# Patient Record
Sex: Female | Born: 1960 | Race: White | Hispanic: No | Marital: Married | Smoking: Never smoker
Health system: Southern US, Community
[De-identification: ages and names within clinical notes are randomized; demographics above are authoritative.]

## PROBLEM LIST (undated history)

## (undated) DIAGNOSIS — Z86018 Personal history of other benign neoplasm: Secondary | ICD-10-CM

## (undated) DIAGNOSIS — Z9889 Other specified postprocedural states: Secondary | ICD-10-CM

## (undated) DIAGNOSIS — I341 Nonrheumatic mitral (valve) prolapse: Secondary | ICD-10-CM

## (undated) DIAGNOSIS — R21 Rash and other nonspecific skin eruption: Secondary | ICD-10-CM

## (undated) DIAGNOSIS — M26609 Unspecified temporomandibular joint disorder, unspecified side: Secondary | ICD-10-CM

## (undated) DIAGNOSIS — Z8619 Personal history of other infectious and parasitic diseases: Secondary | ICD-10-CM

## (undated) DIAGNOSIS — R112 Nausea with vomiting, unspecified: Secondary | ICD-10-CM

## (undated) HISTORY — PX: OTHER SURGICAL HISTORY: SHX169

## (undated) HISTORY — DX: Nonrheumatic mitral (valve) prolapse: I34.1

## (undated) HISTORY — DX: Personal history of other infectious and parasitic diseases: Z86.19

## (undated) HISTORY — DX: Personal history of other benign neoplasm: Z86.018

## (undated) HISTORY — PX: CHOLECYSTECTOMY: SHX55

## (undated) HISTORY — DX: Rash and other nonspecific skin eruption: R21

## (undated) HISTORY — DX: Unspecified temporomandibular joint disorder, unspecified side: M26.609

## (undated) HISTORY — PX: COLONOSCOPY: SHX174

## (undated) HISTORY — PX: WISDOM TOOTH EXTRACTION: SHX21

---

## 1999-06-21 ENCOUNTER — Other Ambulatory Visit: Admission: RE | Admit: 1999-06-21 | Discharge: 1999-06-21 | Payer: Self-pay | Admitting: Obstetrics and Gynecology

## 2000-05-29 ENCOUNTER — Encounter: Admission: RE | Admit: 2000-05-29 | Discharge: 2000-05-29 | Payer: Self-pay | Admitting: Obstetrics and Gynecology

## 2000-05-29 ENCOUNTER — Encounter: Payer: Self-pay | Admitting: Obstetrics and Gynecology

## 2000-07-25 ENCOUNTER — Other Ambulatory Visit: Admission: RE | Admit: 2000-07-25 | Discharge: 2000-07-25 | Payer: Self-pay | Admitting: Obstetrics and Gynecology

## 2001-02-21 ENCOUNTER — Encounter: Payer: Self-pay | Admitting: Obstetrics and Gynecology

## 2001-02-21 ENCOUNTER — Encounter: Admission: RE | Admit: 2001-02-21 | Discharge: 2001-02-21 | Payer: Self-pay | Admitting: Obstetrics and Gynecology

## 2001-08-07 ENCOUNTER — Other Ambulatory Visit: Admission: RE | Admit: 2001-08-07 | Discharge: 2001-08-07 | Payer: Self-pay | Admitting: Obstetrics and Gynecology

## 2001-09-02 ENCOUNTER — Encounter: Admission: RE | Admit: 2001-09-02 | Discharge: 2001-09-02 | Payer: Self-pay | Admitting: Obstetrics and Gynecology

## 2001-09-02 ENCOUNTER — Encounter: Payer: Self-pay | Admitting: Obstetrics and Gynecology

## 2002-02-18 ENCOUNTER — Encounter: Payer: Self-pay | Admitting: Obstetrics and Gynecology

## 2002-02-18 ENCOUNTER — Encounter: Admission: RE | Admit: 2002-02-18 | Discharge: 2002-02-18 | Payer: Self-pay | Admitting: Obstetrics and Gynecology

## 2002-09-04 ENCOUNTER — Other Ambulatory Visit: Admission: RE | Admit: 2002-09-04 | Discharge: 2002-09-04 | Payer: Self-pay | Admitting: Obstetrics and Gynecology

## 2002-09-29 ENCOUNTER — Encounter: Admission: RE | Admit: 2002-09-29 | Discharge: 2002-09-29 | Payer: Self-pay

## 2002-09-29 ENCOUNTER — Encounter: Payer: Self-pay | Admitting: Obstetrics and Gynecology

## 2003-03-17 HISTORY — PX: DILATION AND CURETTAGE OF UTERUS: SHX78

## 2003-03-31 ENCOUNTER — Ambulatory Visit (HOSPITAL_COMMUNITY): Admission: RE | Admit: 2003-03-31 | Discharge: 2003-03-31 | Payer: Self-pay | Admitting: Obstetrics and Gynecology

## 2003-03-31 ENCOUNTER — Encounter (INDEPENDENT_AMBULATORY_CARE_PROVIDER_SITE_OTHER): Payer: Self-pay | Admitting: *Deleted

## 2003-05-19 ENCOUNTER — Encounter: Admission: RE | Admit: 2003-05-19 | Discharge: 2003-05-19 | Payer: Self-pay | Admitting: Obstetrics and Gynecology

## 2003-05-19 ENCOUNTER — Encounter: Payer: Self-pay | Admitting: Obstetrics and Gynecology

## 2003-10-01 ENCOUNTER — Ambulatory Visit (HOSPITAL_COMMUNITY): Admission: RE | Admit: 2003-10-01 | Discharge: 2003-10-01 | Payer: Self-pay | Admitting: Obstetrics and Gynecology

## 2003-10-14 ENCOUNTER — Other Ambulatory Visit: Admission: RE | Admit: 2003-10-14 | Discharge: 2003-10-14 | Payer: Self-pay | Admitting: Obstetrics and Gynecology

## 2004-11-08 ENCOUNTER — Encounter: Admission: RE | Admit: 2004-11-08 | Discharge: 2004-11-08 | Payer: Self-pay | Admitting: Obstetrics and Gynecology

## 2004-11-09 ENCOUNTER — Other Ambulatory Visit: Admission: RE | Admit: 2004-11-09 | Discharge: 2004-11-09 | Payer: Self-pay | Admitting: Obstetrics and Gynecology

## 2005-07-12 ENCOUNTER — Encounter: Admission: RE | Admit: 2005-07-12 | Discharge: 2005-07-12 | Payer: Self-pay | Admitting: Obstetrics and Gynecology

## 2005-09-04 ENCOUNTER — Encounter: Admission: RE | Admit: 2005-09-04 | Discharge: 2005-09-04 | Payer: Self-pay | Admitting: General Surgery

## 2005-10-31 ENCOUNTER — Encounter: Admission: RE | Admit: 2005-10-31 | Discharge: 2005-10-31 | Payer: Self-pay | Admitting: Obstetrics and Gynecology

## 2005-12-05 ENCOUNTER — Other Ambulatory Visit: Admission: RE | Admit: 2005-12-05 | Discharge: 2005-12-05 | Payer: Self-pay | Admitting: Obstetrics and Gynecology

## 2006-09-11 ENCOUNTER — Encounter: Admission: RE | Admit: 2006-09-11 | Discharge: 2006-09-11 | Payer: Self-pay | Admitting: Obstetrics and Gynecology

## 2007-09-17 ENCOUNTER — Encounter: Admission: RE | Admit: 2007-09-17 | Discharge: 2007-09-17 | Payer: Self-pay | Admitting: Obstetrics and Gynecology

## 2007-09-18 ENCOUNTER — Encounter: Admission: RE | Admit: 2007-09-18 | Discharge: 2007-09-18 | Payer: Self-pay | Admitting: Obstetrics and Gynecology

## 2008-04-02 ENCOUNTER — Other Ambulatory Visit: Admission: RE | Admit: 2008-04-02 | Discharge: 2008-04-02 | Payer: Self-pay | Admitting: Obstetrics & Gynecology

## 2008-09-21 ENCOUNTER — Encounter: Admission: RE | Admit: 2008-09-21 | Discharge: 2008-09-21 | Payer: Self-pay | Admitting: Obstetrics & Gynecology

## 2009-09-22 ENCOUNTER — Encounter: Admission: RE | Admit: 2009-09-22 | Discharge: 2009-09-22 | Payer: Self-pay | Admitting: Obstetrics & Gynecology

## 2010-09-22 ENCOUNTER — Encounter
Admission: RE | Admit: 2010-09-22 | Discharge: 2010-09-22 | Payer: Self-pay | Source: Home / Self Care | Admitting: Obstetrics & Gynecology

## 2010-11-05 ENCOUNTER — Encounter: Payer: Self-pay | Admitting: Obstetrics and Gynecology

## 2011-03-03 NOTE — Op Note (Signed)
   NAME:  Sheila Mcbride, Sheila Mcbride                        ACCOUNT NO.:  [id number]   MEDICAL RECORD NO.:  [id number]                   PATIENT TYPE:  AMB   LOCATION:  SDC                                  FACILITY:  [facility]   PHYSICIAN:  Juluis Mire, M.D.                DATE OF BIRTH:  06/17/1961   DATE OF PROCEDURE:  03/31/2003  DATE OF DISCHARGE:                                 OPERATIVE REPORT   PREOPERATIVE DIAGNOSIS:  Abnormal uterine bleeding with endometrial polyp.   POSTOPERATIVE DIAGNOSIS:  Abnormal uterine bleeding with endometrial polyp.   PROCEDURE:  Paracervical block, cervical dilatation, hysteroscopy, resection  of endometrial tissue, endometrial curetting.   SURGEON:  Juluis Mire, M.D.   ANESTHESIA:  Paracervical block and spinal.   ESTIMATED BLOOD LOSS:  Minimal.   PACKS AND DRAINS:  None.   FLUIDS REPLACED:  There was no intra-operative blood placed.   COMPLICATIONS:  None.   INDICATIONS FOR PROCEDURE:  This is dictated in the history and physical.   DESCRIPTION OF PROCEDURE:  The patient was taken to the OR and placed in the  supine position.  After spinal anesthesia, the patient is placed in dorsal  lithotomy position using the Allen stirrups.  The patient's perineum and  vagina were prepped out with Betadine and draped as a sterile field.  Speculum was placed in the vaginal vault.  The cervix was grasped with a  single-tooth tenaculum.  The uterus was sounded to approximately 8 cm, was  posterior.  She did experience some cramping during attempts at dilation.  We instituted a paracervical block using 1% Xylocaine.  Next the cervix was  serially dilated to a size 37 Pratt dilator.  The operative hysteroscope was  then introduced.  The intrauterine cavity was distended using Sorbitol.  Visualization revealed a thickened endometrium posteriorly.  There was no  definitive polyp.  These may have been disrupted by the dilation.  We did  biopsies from the posterior,  anterior and lateral walls.  These were sent  for pathologic review.  We then obtained endometrial curettings.  There were  no signs of perforation or complications.  Bleeding was minimal. Single-  tooth tenaculum and speculum were then removed. The patient was taken out of  the dorsal lithotomy position and once alert, was transferred to the  recovery room in good condition.  Sponge, needle and instrument counts were  reported as correct by the circulating nurse.                                               Juluis Mire, M.D.    JSM/MEDQ  D:  03/31/2003  T:  03/31/2003  Job:  [id number]

## 2011-03-03 NOTE — H&P (Signed)
NAME:  Sheila Mcbride, Sheila Mcbride NO.:  [id number]   MEDICAL RECORD NO.:  [id number]                   PATIENT TYPE:  AMB   LOCATION:  SDC                                  [facility]  [facility]   PHYSICIAN:  Juluis Mire, M.D.                DATE OF BIRTH:  02-Feb-1961   DATE OF ADMISSION:  03/31/2003  DATE OF DISCHARGE:                                HISTORY & PHYSICAL   REASON FOR ADMISSION:  The patient is a 50 year old, G2, P2, married white  female who presents for hysteroscopy with D&C for evaluation of abnormal  uterine bleeding.   HISTORY OF PRESENT ILLNESS:  In relation to the present admission, the  patient is presently using condoms for birth control purposes.  For the past  two months, she has had two episodes of bleeding per month.  This has been  associated with other bright bleeding.  Overall uterine flow has increased.  She underwent a saline infusion ultrasound.  The overall uterine size was  normal, both ovarian volumes were normal.  She had a polypoid mass seen in  the anterior and posterior wall of the uterus that could be polyps or  hyperplastic changes, and she now presents for hysteroscopic resection of  these changes.   ALLERGIES:  KEFLEX.   MEDICATIONS:  None.   PAST MEDICAL HISTORY:  She has a history of mitral valve prolapse; requires  SBE prophylaxis.  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  She had a previous  laparoscopic cholecystectomy performed in 1995.  Laparoscopic evaluation of  the pelvic cavity at that time was unremarkable.   OBSTETRICAL HISTORY:  She has had two spontaneous vaginal deliveries.   FAMILY HISTORY:  Significant for a history of breast cancer, as well as  hypertension.   SOCIAL HISTORY:  No tobacco or alcohol use.   REVIEW OF SYSTEMS:  Noncontributory.   PHYSICAL EXAMINATION:  VITAL SIGNS:  The patient is afebrile, stable vital  signs.  HEENT:   The patient is normocephalic.  Pupils equal, round and reactive to  light and accommodation.  Extraocular movements are intact.  Sclerae and  conjunctivae clear.  Oropharynx clear.  NECK:  Without thyromegaly.  BREASTS:  No discrete masses.  LUNGS:  Clear.  CARDIOVASCULAR:  Regular rate.  No murmurs or gallops.  ABDOMEN:  Benign.  Does have a well-healed midline incision.  No mass,  organomegaly, or tenderness.  PELVIC:  Normal external genitalia.  Vaginal cuff is clear.  Cervix  unremarkable.  Uterus is normal size, shape, and contour.  Adnexa free of  masses or tenderness.  RECTOVAGINAL:  Clear.  EXTREMITIES:  Trace edema.  NEUROLOGIC:  Grossly within normal limits.   IMPRESSION:  1. Abnormal uterine bleeding with evidence of endometrial polyp.  2. Previous resection of pheochromocytoma.   PLAN:  At the present time,  she will undergo a hysteroscopic evaluation of  the endometrium.  The risks of surgery have been discussed including the  risk of anesthetics, the risk of infection, the risk of vascular injury that  could require transfusion or possible hysterectomy, the risk of uterine  perforation that could lead to injury to adjacent organs requiring possible  exploratory laparotomy, risk of deep vein thrombosis, and pulmonary embolus.  The patient expressed understanding of indications and risks.                                               Juluis Mire, M.D.    JSM/MEDQ  D:  03/31/2003  T:  03/31/2003  Job:  [id number]

## 2011-10-18 ENCOUNTER — Other Ambulatory Visit: Payer: Self-pay | Admitting: Obstetrics & Gynecology

## 2011-10-18 DIAGNOSIS — Z1231 Encounter for screening mammogram for malignant neoplasm of breast: Secondary | ICD-10-CM

## 2011-10-27 ENCOUNTER — Other Ambulatory Visit: Payer: Self-pay | Admitting: Obstetrics & Gynecology

## 2011-10-27 DIAGNOSIS — Z803 Family history of malignant neoplasm of breast: Secondary | ICD-10-CM

## 2011-10-30 ENCOUNTER — Ambulatory Visit
Admission: RE | Admit: 2011-10-30 | Discharge: 2011-10-30 | Disposition: A | Discharge status: Home / Self Care | Payer: 59 | Source: Ambulatory Visit | Attending: Obstetrics & Gynecology | Admitting: Obstetrics & Gynecology

## 2011-10-30 DIAGNOSIS — Z1231 Encounter for screening mammogram for malignant neoplasm of breast: Secondary | ICD-10-CM

## 2011-11-06 ENCOUNTER — Other Ambulatory Visit: Payer: Self-pay | Admitting: Obstetrics & Gynecology

## 2011-11-06 DIAGNOSIS — R928 Other abnormal and inconclusive findings on diagnostic imaging of breast: Secondary | ICD-10-CM

## 2011-11-08 ENCOUNTER — Ambulatory Visit
Admission: RE | Admit: 2011-11-08 | Discharge: 2011-11-08 | Disposition: A | Discharge status: Home / Self Care | Payer: 59 | Source: Ambulatory Visit | Attending: Obstetrics & Gynecology | Admitting: Obstetrics & Gynecology

## 2011-11-08 DIAGNOSIS — Z803 Family history of malignant neoplasm of breast: Secondary | ICD-10-CM

## 2011-11-08 MED ORDER — GADOBENATE DIMEGLUMINE 529 MG/ML IV SOLN
9.0000 mL | Freq: Once | INTRAVENOUS | Status: AC | PRN
  Administered 2011-11-08: 9 mL via INTRAVENOUS

## 2011-11-09 ENCOUNTER — Ambulatory Visit
Admission: RE | Admit: 2011-11-09 | Discharge: 2011-11-09 | Disposition: A | Discharge status: Home / Self Care | Payer: 59 | Source: Ambulatory Visit | Attending: Obstetrics & Gynecology | Admitting: Obstetrics & Gynecology

## 2011-11-09 DIAGNOSIS — R928 Other abnormal and inconclusive findings on diagnostic imaging of breast: Secondary | ICD-10-CM

## 2012-08-20 ENCOUNTER — Ambulatory Visit (AMBULATORY_SURGERY_CENTER): Payer: 59

## 2012-08-20 VITALS — Ht 61.0 in | Wt 114.4 lb

## 2012-08-20 DIAGNOSIS — Z1211 Encounter for screening for malignant neoplasm of colon: Secondary | ICD-10-CM

## 2012-08-20 MED ORDER — MOVIPREP 100 G PO SOLR: ORAL | Status: DC

## 2012-08-21 ENCOUNTER — Encounter: Payer: Self-pay | Admitting: Internal Medicine

## 2012-09-03 ENCOUNTER — Encounter: Payer: 59 | Admitting: Internal Medicine

## 2012-09-06 ENCOUNTER — Ambulatory Visit (AMBULATORY_SURGERY_CENTER): Payer: 59 | Admitting: Internal Medicine

## 2012-09-06 ENCOUNTER — Encounter: Payer: Self-pay | Admitting: Internal Medicine

## 2012-09-06 VITALS — BP 125/75 | HR 85 | Temp 98.0°F | Resp 28 | Ht 61.0 in | Wt 114.0 lb

## 2012-09-06 DIAGNOSIS — Z1211 Encounter for screening for malignant neoplasm of colon: Secondary | ICD-10-CM

## 2012-09-06 MED ORDER — SODIUM CHLORIDE 0.9 % IV SOLN: 500.0000 mL | INTRAVENOUS | Status: DC

## 2012-09-06 NOTE — Progress Notes (Signed)
Patient did not have preoperative order for IV antibiotic SSI prophylaxis. (G8918)Patient did not have preoperative order for IV antibiotic SSI prophylaxis. (G8918)Patient did not experience any of the following events: a burn prior to discharge; a fall within the facility; wrong site/side/patient/procedure/implant event; or a hospital transfer or hospital admission upon discharge from the facility. (G8907) 

## 2012-09-06 NOTE — Op Note (Signed)
[facility] [address] [place], [address]   COLONOSCOPY PROCEDURE REPORT  PATIENT: Sheila Mcbride, Sheila Mcbride  MR#: [id number] BIRTHDATE: 04/16/1961 , 51  yrs. old GENDER: Female ENDOSCOPIST: Hart Carwin, MD REFERRED BY:  Leda Quail, M.D. PROCEDURE DATE:  09/06/2012 PROCEDURE:   Colonoscopy, screening ASA CLASS:   Class I INDICATIONS:Average risk patient for colon cancer. MEDICATIONS: MAC sedation, administered by CRNA and Propofol (Diprivan) 230 mg IV  DESCRIPTION OF PROCEDURE:   After the risks and benefits and of the procedure were explained, informed consent was obtained.  A digital rectal exam revealed no abnormalities of the rectum.    The LB [id number]-[id number]  endoscope was introduced through the anus and advanced to the cecum, which was identified by both the appendix and ileocecal valve .  The quality of the prep was good, using MoviPrep .  The instrument was then slowly withdrawn as the colon was fully examined.     COLON FINDINGS: Mild diverticulosis was noted throughout the entire examined colon.     Retroflexed views revealed no abnormalities. The scope was then withdrawn from the patient and the procedure completed.  COMPLICATIONS: There were no complications. ENDOSCOPIC IMPRESSION: Mild diverticulosis was noted throughout the entire examined colon  RECOMMENDATIONS: High fiber diet   REPEAT EXAM: In 10 year(s)  for Colonoscopy.  cc:  _______________________________ eSignedHart Carwin, MD 09/06/2012 9:09 AM

## 2012-09-06 NOTE — Patient Instructions (Addendum)
YOU HAD AN ENDOSCOPIC PROCEDURE TODAY AT THE [facility]: Refer to the procedure report that was given to you for any specific questions about what was found during the examination.  If the procedure report does not answer your questions, please call your gastroenterologist to clarify.  If you requested that your care partner not be given the details of your procedure findings, then the procedure report has been included in a sealed envelope for you to review at your convenience later.  YOU SHOULD EXPECT: Some feelings of bloating in the abdomen. Passage of more gas than usual.  Walking can help get rid of the air that was put into your GI tract during the procedure and reduce the bloating. If you had a lower endoscopy (such as a colonoscopy or flexible sigmoidoscopy) you may notice spotting of blood in your stool or on the toilet paper. If you underwent a bowel prep for your procedure, then you may not have a normal bowel movement for a few days.  DIET: Your first meal following the procedure should be a light meal and then it is ok to progress to your normal diet.  A half-sandwich or bowl of soup is an example of a good first meal.  Heavy or fried foods are harder to digest and may make you feel nauseous or bloated.  Likewise meals heavy in dairy and vegetables can cause extra gas to form and this can also increase the bloating.  Drink plenty of fluids but you should avoid alcoholic beverages for 24 hours.  ACTIVITY: Your care partner should take you home directly after the procedure.  You should plan to take it easy, moving slowly for the rest of the day.  You can resume normal activity the day after the procedure however you should NOT DRIVE or use heavy machinery for 24 hours (because of the sedation medicines used during the test).    SYMPTOMS TO REPORT IMMEDIATELY: A gastroenterologist can be reached at any hour.  During normal business hours, 8:30 AM to 5:00 PM Monday through Friday,  call [phone].  After hours and on weekends, please call the GI answering service at [phone] who will take a message and have the physician on call contact you.   Following lower endoscopy (colonoscopy or flexible sigmoidoscopy):  Excessive amounts of blood in the stool  Significant tenderness or worsening of abdominal pains  Swelling of the abdomen that is new, acute  Fever of 100F or higher    FOLLOW UP: If any biopsies were taken you will be contacted by phone or by letter within the next 1-3 weeks.  Call your gastroenterologist if you have not heard about the biopsies in 3 weeks.  Our staff will call the home number listed on your records the next business day following your procedure to check on you and address any questions or concerns that you may have at that time regarding the information given to you following your procedure. This is a courtesy call and so if there is no answer at the home number and we have not heard from you through the emergency physician on call, we will assume that you have returned to your regular daily activities without incident.  SIGNATURES/CONFIDENTIALITY: You and/or your care partner have signed paperwork which will be entered into your electronic medical record.  These signatures attest to the fact that that the information above on your After Visit Summary has been reviewed and is understood.  Full responsibility of the confidentiality   of this discharge information lies with you and/or your care-partner.     Information on diverticulosis & high fiber diet given to you today 

## 2012-09-09 ENCOUNTER — Telehealth: Payer: Self-pay | Admitting: *Deleted

## 2012-09-09 NOTE — Telephone Encounter (Signed)
  Follow up Call-  Call back number 09/06/2012  Post procedure Call Back phone  # [phone]  Permission to leave phone message Yes     Patient questions:  Do you have a fever, pain , or abdominal swelling? no Pain Score  0 *  Have you tolerated food without any problems? yes  Have you been able to return to your normal activities? yes  Do you have any questions about your discharge instructions: Diet   no Medications  no Follow up visit  no  Do you have questions or concerns about your Care? no  Actions: * If pain score is 4 or above: No action needed, pain <4.

## 2012-10-03 ENCOUNTER — Other Ambulatory Visit: Payer: Self-pay | Admitting: Obstetrics & Gynecology

## 2012-10-03 DIAGNOSIS — Z1231 Encounter for screening mammogram for malignant neoplasm of breast: Secondary | ICD-10-CM

## 2012-11-07 ENCOUNTER — Ambulatory Visit
Admission: RE | Admit: 2012-11-07 | Discharge: 2012-11-07 | Disposition: A | Discharge status: Home / Self Care | Payer: 59 | Source: Ambulatory Visit | Attending: Obstetrics & Gynecology | Admitting: Obstetrics & Gynecology

## 2012-11-07 DIAGNOSIS — Z1231 Encounter for screening mammogram for malignant neoplasm of breast: Secondary | ICD-10-CM

## 2013-07-10 ENCOUNTER — Telehealth: Payer: Self-pay | Admitting: Obstetrics & Gynecology

## 2013-07-10 MED ORDER — ESTRADIOL 10 MCG VA TABS: ORAL_TABLET | VAGINAL | Status: DC

## 2013-07-10 NOTE — Telephone Encounter (Signed)
Pt calling to check on refill for vagifem that should have been sent to [facility] in [place]. Pharmacy number is [phone].

## 2013-07-10 NOTE — Telephone Encounter (Signed)
According to chart patient has been on Vagifem 10 mcg Last Refilled 06/07/11 #8 packs with refills x 1 year, patient has picked up samples last sample picked up 10/01/12.  AEX scheduled for 08/14/13 #8/1 rf's sent to last patient until [facility].  Patient is notified and aware.

## 2013-08-14 ENCOUNTER — Ambulatory Visit (INDEPENDENT_AMBULATORY_CARE_PROVIDER_SITE_OTHER): Payer: 59 | Admitting: Obstetrics & Gynecology

## 2013-08-14 ENCOUNTER — Encounter: Payer: Self-pay | Admitting: Obstetrics & Gynecology

## 2013-08-14 VITALS — BP 132/82 | HR 68 | Resp 16 | Ht 61.0 in | Wt 113.6 lb

## 2013-08-14 DIAGNOSIS — Z01419 Encounter for gynecological examination (general) (routine) without abnormal findings: Secondary | ICD-10-CM

## 2013-08-14 DIAGNOSIS — Z0000 Encounter for general adult medical examination without abnormal findings: Secondary | ICD-10-CM

## 2013-08-14 DIAGNOSIS — Z124 Encounter for screening for malignant neoplasm of cervix: Secondary | ICD-10-CM

## 2013-08-14 LAB — POCT URINALYSIS DIPSTICK
Glucose, UA: NEGATIVE
Ketones, UA: NEGATIVE
Protein, UA: NEGATIVE

## 2013-08-14 LAB — HEMOGLOBIN, FINGERSTICK: Hemoglobin, fingerstick: 13.7 g/dL (ref 12.0–16.0)

## 2013-08-14 MED ORDER — ESTROGENS, CONJUGATED 0.625 MG/GM VA CREA: TOPICAL_CREAM | VAGINAL | Status: DC

## 2013-08-14 NOTE — Patient Instructions (Signed)

## 2013-08-14 NOTE — Progress Notes (Signed)
52 y.o. G2P2 MarriedCaucasianF here for annual exam.  Feels like vagifem isn't working as well for her over last several months.  Would like to try something else for vaginal dryness.    Family hx of breast cancer in mother.  BRCA 1/2 testing last year negative.  This is MRI/MMG year, coming up.    Patient's last menstrual period was 10/16/2006.          Sexually active: yes  The current method of family planning is vasectomy.    Exercising: yes  circuit/body pump Smoker:  no  Health Maintenance: Pap:  06/27/12 WNL History of abnormal Pap:  yes MMG:  11/07/12 normal Colonoscopy:  11/13 repeat in 10 years, Dr Juanda Chance BMD:   12/11, -1.0/-1.0 TDaP:  06/27/12 Screening Labs: 2012, Hb today: 13.7, Urine today: WBC-1+   reports that she has never smoked. She has never used smokeless tobacco. She reports that she does not drink alcohol or use illicit drugs.  Past Medical History  Diagnosis Date  . H/O pheochromocytoma   . Rash     all over body in 03/13/? etiology  . MVP (mitral valve prolapse)   . H/O [facility] spotted fever   . TMJ (temporomandibular joint syndrome)     Past Surgical History  Procedure Laterality Date  . Pheochromocytoma      removal in  2000  . Cholecystectomy      and exploratory surgery in pelvic area  . Wisdom tooth extraction    . Dilation and curettage of uterus  6/04    Current Outpatient Prescriptions  Medication Sig Dispense Refill  . Estradiol (VAGIFEM) 10 MCG TABS vaginal tablet Insert 1 tablet vaginally twice weekly  8 tablet  1  . Acetaminophen (TYLENOL 8 HOUR PO) Take by mouth as needed.      . Ibuprofen (ADVIL) 200 MG CAPS Take by mouth as needed.       No current facility-administered medications for this visit.    Family History  Problem Relation Age of Onset  . Breast cancer Mother 60    deceased age 37 from blood clot in lung secondary complications in breast cancer treatment  . Heart disease Paternal Grandfather   . Bladder  Cancer Maternal Grandfather   . Osteoporosis Maternal Grandmother   . Rheum arthritis Maternal Grandmother     ROS:  Pertinent items are noted in HPI.  Otherwise, a comprehensive ROS was negative.  Exam:   BP 132/82  Pulse 68  Resp 16  Ht 5\' 1"  (1.549 m)  Wt 113 lb 9.6 oz (51.529 kg)  BMI 21.48 kg/m2  LMP 10/16/2006  Weight change: -1lb  Height: 5\' 1"  (154.9 cm)  Ht Readings from Last 3 Encounters:  08/14/13 5\' 1"  (1.549 m)  09/06/12 5\' 1"  (1.549 m)  08/20/12 5\' 1"  (1.549 m)    General appearance: alert, cooperative and appears stated age Head: Normocephalic, without obvious abnormality, atraumatic Neck: no adenopathy, supple, symmetrical, trachea midline and thyroid normal to inspection and palpation Lungs: clear to auscultation bilaterally Breasts: normal appearance, no masses or tenderness Heart: regular rate and rhythm Abdomen: soft, non-tender; bowel sounds normal; no masses,  no organomegaly Extremities: extremities normal, atraumatic, no cyanosis or edema Skin: Skin color, texture, turgor normal. No rashes or lesions Lymph nodes: Cervical, supraclavicular, and axillary nodes normal. No abnormal inguinal nodes palpated Neurologic: Grossly normal   Pelvic: External genitalia:  no lesions              Urethra:  normal appearing urethra with no masses, tenderness or lesions              Bartholins and Skenes: normal                 Vagina: normal appearing vagina with normal color and discharge, no lesions              Cervix: no lesions              Pap taken: yes Bimanual Exam:  Uterus:  normal size, contour, position, consistency, mobility, non-tender              Adnexa: normal adnexa and no mass, fullness, tenderness, no masses               Rectovaginal: Confirms               Anus:  normal sphincter tone, no lesions  A:  Well Woman with normal exam PMP, no HRT. Mother with hx of breast cancer, BRCA 1/2 negative 10/13  P:   Mammogram, 3D, and MRI in 1/15.   Reminder placed to schedule. pap smear obtained today. Trail of premarin vaginal cream 1/2 pv twice weekly.  Rx to pharmacy.  Pt to give update in 2 months. return annually or prn  An After Visit Summary was printed and given to the patient.

## 2013-08-19 LAB — IPS PAP SMEAR ONLY

## 2013-11-11 ENCOUNTER — Other Ambulatory Visit: Payer: Self-pay

## 2013-11-11 DIAGNOSIS — Z1231 Encounter for screening mammogram for malignant neoplasm of breast: Secondary | ICD-10-CM

## 2013-11-12 ENCOUNTER — Ambulatory Visit
Admission: RE | Admit: 2013-11-12 | Discharge: 2013-11-12 | Disposition: A | Discharge status: Home / Self Care | Payer: 59 | Source: Ambulatory Visit

## 2013-11-12 DIAGNOSIS — Z1231 Encounter for screening mammogram for malignant neoplasm of breast: Secondary | ICD-10-CM

## 2013-11-18 ENCOUNTER — Telehealth: Payer: Self-pay | Admitting: Emergency Medicine

## 2013-11-18 DIAGNOSIS — Z803 Family history of malignant neoplasm of breast: Secondary | ICD-10-CM

## 2013-11-18 NOTE — Telephone Encounter (Signed)
Message copied by Michele Mcalpine on Tue Nov 18, 2013 10:53 AM ------      Message from: Megan Salon      Created: Tue Nov 18, 2013  5:29 AM      Regarding: MMG and MRI       Pt has very strong hx of breast cancer.  Has yearly MMG and then every other year MRI.  This is yr for both.  Can you schedule at breast center?            MSM      ----- Message -----         From: Lyman Speller, MD         Sent: 08/14/2013   2:34 PM           To: Lyman Speller, MD      Subject: schedule and precert MRT                                        ------

## 2013-11-18 NOTE — Telephone Encounter (Signed)
Spoke with patient. She cannot keep first available appointment. 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 She will call to r/s and I advised that we will precert with insurance.

## 2013-11-18 NOTE — Telephone Encounter (Signed)
Message left to return call to [place] at [phone].   Patient scheduled for Bilateral Breast MRI w and wo contrast for [place] for 11/26/13 at 0945.   Order to [place] for Pre authorization.

## 2013-11-18 NOTE — Telephone Encounter (Signed)
ID: [id number] DOB: December 26, 1960 CPT: 28206 Dx: V16.3   Effective Date: 06.10.2014 Termination Date: Pre-Exisiting: n/a  Date: 02.03.2015 Time: 1104 Rep: May  PAC: not needed, it is recommended that medical records be submitted with claim. Ref# [id number]

## 2013-11-26 ENCOUNTER — Other Ambulatory Visit: Payer: 59

## 2013-12-02 ENCOUNTER — Ambulatory Visit
Admission: RE | Admit: 2013-12-02 | Discharge: 2013-12-02 | Disposition: A | Discharge status: Home / Self Care | Payer: 59 | Source: Ambulatory Visit | Attending: Obstetrics & Gynecology | Admitting: Obstetrics & Gynecology

## 2013-12-02 DIAGNOSIS — Z803 Family history of malignant neoplasm of breast: Secondary | ICD-10-CM

## 2013-12-02 MED ORDER — GADOBENATE DIMEGLUMINE 529 MG/ML IV SOLN
10.0000 mL | Freq: Once | INTRAVENOUS | Status: AC | PRN
  Administered 2013-12-02: 10 mL via INTRAVENOUS

## 2013-12-05 NOTE — Telephone Encounter (Signed)
Message copied by Michele Mcalpine on Fri Dec 05, 2013  9:01 AM ------      Message from: Megan Salon      Created: Thu Dec 04, 2013  1:15 PM       Inform MRI nl.  Out of hold. ------

## 2013-12-05 NOTE — Telephone Encounter (Signed)
Spoke with patient. Message from Dr. Sabra Heck given, verbalized understanding.

## 2014-03-04 ENCOUNTER — Other Ambulatory Visit: Payer: Self-pay | Admitting: Obstetrics & Gynecology

## 2014-03-05 NOTE — Telephone Encounter (Signed)
Patient calling to check on the status of this. °

## 2014-03-05 NOTE — Telephone Encounter (Signed)
Please advise if ok to give refills. Patient was given trial of premarin cream at [facility] on 08/14/13-was to give update in 2 months, no phone note. I have left message for patient to call back, no response. Normal 3D MMG done 11/12/13, breast MRI done 12/04/13//kn

## 2014-03-13 ENCOUNTER — Telehealth: Payer: Self-pay | Admitting: Obstetrics & Gynecology

## 2014-03-13 NOTE — Telephone Encounter (Signed)
PREMARIN vaginal cream  apply 1/2 gram vaginally TWICE A WEEK, Normal, Last Dose: Not Recorded    [facility] [phone]

## 2014-03-13 NOTE — Telephone Encounter (Signed)
Refill request for PREMARIN VAG CREAM Last filled by MD on - 03/06/14, #30 Last AEX - 08/14/13  Advised pt that RX was filled last week at [facility].  Advised she can have transferred to [facility] if needed.  Pt OK with picking up at [facility].

## 2014-08-17 ENCOUNTER — Encounter: Payer: Self-pay | Admitting: Obstetrics & Gynecology

## 2014-09-14 ENCOUNTER — Ambulatory Visit (INDEPENDENT_AMBULATORY_CARE_PROVIDER_SITE_OTHER): Payer: 59 | Admitting: Obstetrics & Gynecology

## 2014-09-14 ENCOUNTER — Encounter: Payer: Self-pay | Admitting: Obstetrics & Gynecology

## 2014-09-14 VITALS — BP 126/70 | HR 96 | Resp 16 | Ht 61.0 in | Wt 115.0 lb

## 2014-09-14 DIAGNOSIS — Z01419 Encounter for gynecological examination (general) (routine) without abnormal findings: Secondary | ICD-10-CM

## 2014-09-14 DIAGNOSIS — Z124 Encounter for screening for malignant neoplasm of cervix: Secondary | ICD-10-CM

## 2014-09-14 DIAGNOSIS — Z0000 Encounter for general adult medical examination without abnormal findings: Secondary | ICD-10-CM

## 2014-09-14 LAB — POCT URINALYSIS DIPSTICK
BILIRUBIN UA: NEGATIVE
GLUCOSE UA: NEGATIVE
KETONES UA: NEGATIVE
Leukocytes, UA: NEGATIVE
NITRITE UA: NEGATIVE
PH UA: 5
Protein, UA: NEGATIVE
RBC UA: NEGATIVE
Urobilinogen, UA: NEGATIVE

## 2014-09-14 LAB — HEMOGLOBIN, FINGERSTICK: Hemoglobin, fingerstick: 14.2 g/dL (ref 12.0–16.0)

## 2014-09-14 MED ORDER — ESTROGENS, CONJUGATED 0.625 MG/GM VA CREA: TOPICAL_CREAM | VAGINAL | Status: DC

## 2014-09-14 MED ORDER — LIDOCAINE 5 % EX OINT: TOPICAL_OINTMENT | CUTANEOUS | Status: DC

## 2014-09-14 NOTE — Progress Notes (Signed)
53 y.o. G2P2 MarriedCaucasianF here for annual exam.  No vaginal bleeding.  Son is in his first year of residency at [employer].    Family hx of breast cancer in mother.  BRCA testing was negative two years ago.  Doing MRI every other year.  Done 2/15.  Will do 3D MMG again this next year.    Patient's last menstrual period was 10/16/2006.          Sexually active: Yes.    The current method of family planning is post menopausal status.    Exercising: Yes.    Cardio, resistance training Smoker:  no  Health Maintenance: Pap: 08/14/13 Neg History of abnormal Pap:  yes MMG:  MR w/wo Contrast Bilateral BIRADS1: Neg Colonoscopy: 08/2012 Mild diverticulosis. Repeat 10 years  BMD:  2011, -1.0/-1.0 TDaP:  06/2012 Screening Labs: here, Hb today: 14.2, Urine today: clear   reports that she has never smoked. She has never used smokeless tobacco. She reports that she does not drink alcohol or use illicit drugs.  Past Medical History  Diagnosis Date  . H/O pheochromocytoma   . Rash     all over body in 03/13/? etiology  . MVP (mitral valve prolapse)   . H/O [facility] spotted fever   . TMJ (temporomandibular joint syndrome)     Past Surgical History  Procedure Laterality Date  . Pheochromocytoma      removal in  2000  . Cholecystectomy      and exploratory surgery in pelvic area  . Wisdom tooth extraction    . Dilation and curettage of uterus  6/04    Current Outpatient Prescriptions  Medication Sig Dispense Refill  . Acetaminophen (TYLENOL 8 HOUR PO) Take by mouth as needed.    . Ibuprofen (ADVIL) 200 MG CAPS Take by mouth as needed.    Marland Kitchen PREMARIN vaginal cream apply 1/2 gram vaginally TWICE A WEEK 30 g 0   No current facility-administered medications for this visit.    Family History  Problem Relation Age of Onset  . Breast cancer Mother 62    deceased age 39 from blood clot in lung secondary complications in breast cancer treatment  . Heart disease Paternal  Grandfather   . Bladder Cancer Maternal Grandfather   . Osteoporosis Maternal Grandmother   . Rheum arthritis Maternal Grandmother     ROS:  Pertinent items are noted in HPI.  Otherwise, a comprehensive ROS was negative.  Exam:   BP 126/70 mmHg  Pulse 96  Resp 16  Ht 5' 1"  (1.549 m)  Wt 115 lb (52.164 kg)  BMI 21.74 kg/m2  LMP 10/16/2006  Weight change: +2#  Height: 5' 1"  (154.9 cm)  Ht Readings from Last 3 Encounters:  09/14/14 5' 1"  (1.549 m)  08/14/13 5' 1"  (1.549 m)  09/06/12 5' 1"  (1.549 m)    General appearance: alert, cooperative and appears stated age Head: Normocephalic, without obvious abnormality, atraumatic Neck: no adenopathy, supple, symmetrical, trachea midline and thyroid normal to inspection and palpation Lungs: clear to auscultation bilaterally Breasts: normal appearance, no masses or tenderness Heart: regular rate and rhythm Abdomen: soft, non-tender; bowel sounds normal; no masses,  no organomegaly Extremities: extremities normal, atraumatic, no cyanosis or edema Skin: Skin color, texture, turgor normal. No rashes or lesions Lymph nodes: Cervical, supraclavicular, and axillary nodes normal. No abnormal inguinal nodes palpated Neurologic: Grossly normal   Pelvic: External genitalia:  no lesions  Urethra:  normal appearing urethra with no masses, tenderness or lesions              Bartholins and Skenes: normal                 Vagina: normal appearing vagina with normal color and discharge, no lesions              Cervix: no lesions              Pap taken: Yes.   Bimanual Exam:  Uterus:  normal size, contour, position, consistency, mobility, non-tender              Adnexa: normal adnexa and no mass, fullness, tenderness               Rectovaginal: Confirms               Anus:  normal sphincter tone, no lesions  A:  Well Woman with normal exam PMP, no HRT. Mother with hx of breast cancer, BRCA 1/2 negative 10/13.  MRI every other year.   Last done 2/15.  P: Mammogram.  Doing 3D yearly.  MRI every other year. pap smear obtained today.  Pt aware of guidelines and desires yearly.   Continue premarin vaginal cream 1/2 gram pv twice weekly.   Trial of topical Lidocaine 5% to perineum with intercourse. CMP, Lipids, TSh return annually or prn  An After Visit Summary was printed and given to the patient.

## 2014-09-15 ENCOUNTER — Telehealth: Payer: Self-pay

## 2014-09-15 LAB — COMPREHENSIVE METABOLIC PANEL
ALBUMIN: 4.6 g/dL (ref 3.5–5.2)
ALT: 16 U/L (ref 0–35)
AST: 27 U/L (ref 0–37)
Alkaline Phosphatase: 68 U/L (ref 39–117)
BUN: 13 mg/dL (ref 6–23)
CO2: 27 mEq/L (ref 19–32)
CREATININE: 0.66 mg/dL (ref 0.50–1.10)
Calcium: 9.9 mg/dL (ref 8.4–10.5)
Chloride: 98 mEq/L (ref 96–112)
GLUCOSE: 108 mg/dL — AB (ref 70–99)
POTASSIUM: 4 meq/L (ref 3.5–5.3)
Sodium: 138 mEq/L (ref 135–145)
Total Bilirubin: 0.8 mg/dL (ref 0.2–1.2)
Total Protein: 7.2 g/dL (ref 6.0–8.3)

## 2014-09-15 LAB — TSH: TSH: 2.182 u[IU]/mL (ref 0.350–4.500)

## 2014-09-15 LAB — LIPID PANEL
CHOLESTEROL: 187 mg/dL (ref 0–200)
HDL: 76 mg/dL (ref >39)
LDL CALC: 97 mg/dL (ref 0–99)
TRIGLYCERIDES: 69 mg/dL (ref <150)
Total CHOL/HDL Ratio: 2.5 Ratio
VLDL: 14 mg/dL (ref 0–40)

## 2014-09-15 NOTE — Telephone Encounter (Signed)
-----   Message from Lyman Speller, MD sent at 09/15/2014 12:34 PM EST ----- Please inform labs are normal except CMP showed mildly elevated glucose.  Pt wasn't fasting so this is fine.  Lipids, TSH, and remainder of CMP was normal.

## 2014-09-15 NOTE — Telephone Encounter (Signed)
Lmtcb//kn 

## 2014-09-16 LAB — IPS PAP SMEAR ONLY

## 2014-10-05 NOTE — Telephone Encounter (Signed)
Patient notified of all results.//kn 

## 2014-11-09 ENCOUNTER — Other Ambulatory Visit: Payer: Self-pay

## 2014-11-09 DIAGNOSIS — Z1231 Encounter for screening mammogram for malignant neoplasm of breast: Secondary | ICD-10-CM

## 2014-11-13 ENCOUNTER — Ambulatory Visit: Payer: Self-pay

## 2014-11-16 ENCOUNTER — Ambulatory Visit
Admission: RE | Admit: 2014-11-16 | Discharge: 2014-11-16 | Disposition: A | Discharge status: Home / Self Care | Payer: 59 | Source: Ambulatory Visit

## 2014-11-16 DIAGNOSIS — Z1231 Encounter for screening mammogram for malignant neoplasm of breast: Secondary | ICD-10-CM

## 2015-11-09 ENCOUNTER — Telehealth: Payer: Self-pay | Admitting: Obstetrics & Gynecology

## 2015-11-09 DIAGNOSIS — Z803 Family history of malignant neoplasm of breast: Secondary | ICD-10-CM

## 2015-11-09 DIAGNOSIS — R922 Inconclusive mammogram: Secondary | ICD-10-CM

## 2015-11-09 DIAGNOSIS — Z1239 Encounter for other screening for malignant neoplasm of breast: Secondary | ICD-10-CM

## 2015-11-09 NOTE — Telephone Encounter (Signed)
Patient called and said, "I am due for a mammogram at [facility] but it is also time for me to have an MRI of my breasts--I have that done every two years. I need a little guidance on how to best coordinate all this."

## 2015-11-09 NOTE — Telephone Encounter (Addendum)
Patient returned call. She is advised she will need to schedule mammogram first, then will need to schedule Breast MRI.  Breast MRI order has been placed.   Patient will call to schedule screening mammogram and when results of mammogram screening and in, will need to schedule breast mri. Patient agreeable.  Will call back with any concerns. Routing to provider for final review. Patient agreeable to disposition. Will close encounter.

## 2015-11-09 NOTE — Telephone Encounter (Signed)
Notes Recorded by Lyman Speller, MD on 11/17/2014 at 9:39 PM Pt had neg MRI 2/15. Will need MRI after MMG next year--2017.  Orders placed for 3D Screening mammogram and bilateral Breast MRI.   Call to patient. She is at work, she will call after 2:20 to discuss.

## 2015-11-11 ENCOUNTER — Telehealth: Payer: Self-pay | Admitting: Obstetrics & Gynecology

## 2015-11-11 NOTE — Telephone Encounter (Signed)
[place] [phone] to initiate pre-authorization for an MRI, CPT code [id number], advised by Mae (call reference [phone]) to fax clinicals to the Claims Dept at fax number [phone] for manual review. Notes faxed today

## 2015-11-16 ENCOUNTER — Ambulatory Visit (INDEPENDENT_AMBULATORY_CARE_PROVIDER_SITE_OTHER): Payer: 59 | Admitting: Obstetrics & Gynecology

## 2015-11-16 ENCOUNTER — Encounter: Payer: Self-pay | Admitting: Obstetrics & Gynecology

## 2015-11-16 VITALS — BP 142/84 | HR 88 | Resp 16 | Ht 60.75 in | Wt 112.0 lb

## 2015-11-16 DIAGNOSIS — Z0000 Encounter for general adult medical examination without abnormal findings: Secondary | ICD-10-CM

## 2015-11-16 DIAGNOSIS — Z124 Encounter for screening for malignant neoplasm of cervix: Secondary | ICD-10-CM

## 2015-11-16 DIAGNOSIS — I459 Conduction disorder, unspecified: Secondary | ICD-10-CM | POA: Diagnosis not present

## 2015-11-16 DIAGNOSIS — Z01419 Encounter for gynecological examination (general) (routine) without abnormal findings: Secondary | ICD-10-CM | POA: Diagnosis not present

## 2015-11-16 DIAGNOSIS — N952 Postmenopausal atrophic vaginitis: Secondary | ICD-10-CM

## 2015-11-16 DIAGNOSIS — Z205 Contact with and (suspected) exposure to viral hepatitis: Secondary | ICD-10-CM

## 2015-11-16 LAB — COMPREHENSIVE METABOLIC PANEL
ALT: 13 U/L (ref 6–29)
AST: 24 U/L (ref 10–35)
Albumin: 4.3 g/dL (ref 3.6–5.1)
Alkaline Phosphatase: 70 U/L (ref 33–130)
BUN: 12 mg/dL (ref 7–25)
CHLORIDE: 101 mmol/L (ref 98–110)
CO2: 29 mmol/L (ref 20–31)
CREATININE: 0.73 mg/dL (ref 0.50–1.05)
Calcium: 10 mg/dL (ref 8.6–10.4)
GLUCOSE: 91 mg/dL (ref 65–99)
POTASSIUM: 4 mmol/L (ref 3.5–5.3)
SODIUM: 139 mmol/L (ref 135–146)
TOTAL PROTEIN: 7.5 g/dL (ref 6.1–8.1)
Total Bilirubin: 0.7 mg/dL (ref 0.2–1.2)

## 2015-11-16 LAB — POCT URINALYSIS DIPSTICK
Bilirubin, UA: NEGATIVE
Glucose, UA: NEGATIVE
KETONES UA: NEGATIVE
Leukocytes, UA: NEGATIVE
Nitrite, UA: NEGATIVE
PH UA: 6
PROTEIN UA: NEGATIVE
RBC UA: NEGATIVE
UROBILINOGEN UA: NEGATIVE

## 2015-11-16 LAB — LIPID PANEL
CHOL/HDL RATIO: 2.2 ratio (ref <=5.0)
Cholesterol: 178 mg/dL (ref 125–200)
HDL: 80 mg/dL (ref >=46)
LDL CALC: 85 mg/dL (ref <130)
Triglycerides: 64 mg/dL (ref <150)
VLDL: 13 mg/dL (ref <30)

## 2015-11-16 MED ORDER — ESTROGENS, CONJUGATED 0.625 MG/GM VA CREA: TOPICAL_CREAM | VAGINAL | Status: DC

## 2015-11-16 NOTE — Progress Notes (Signed)
55 y.o. G2P2 MarriedCaucasianF here for annual exam.  Doing well.  Son is in her second year of internal medicine residency.  His wife is expecting.  Daughter is in [place] and her husband is in medical school at [employer].    PCP:  Dr. Melford Aase  Patient's last menstrual period was 10/16/2006.          Sexually active: Yes.    The current method of family planning is post menopausal status.    Exercising: Yes.    resistance training, cardio intervals  Smoker:  no  Health Maintenance: Pap:  09/14/14 Neg. History of abnormal Pap:  yes MMG: 11/17/14 BIRADS1:neg. Screening MMG and  MRI Breast Bilateral scheduled 11/29/15 Colonoscopy:  09/06/12 Mild Diverticulosis- repeat 10 years  BMD:  09/22/10 Normal  TDaP:  06/2012 Screening Labs: Here, Hb today: pending, Urine today: pending    reports that she has never smoked. She has never used smokeless tobacco. She reports that she does not drink alcohol or use illicit drugs.  Past Medical History  Diagnosis Date  . H/O pheochromocytoma   . Rash     all over body in 03/13/? etiology  . MVP (mitral valve prolapse)   . H/O [facility] spotted fever   . TMJ (temporomandibular joint syndrome)     Past Surgical History  Procedure Laterality Date  . Pheochromocytoma      removal in  2000  . Cholecystectomy      and exploratory surgery in pelvic area  . Wisdom tooth extraction    . Dilation and curettage of uterus  6/04    Current Outpatient Prescriptions  Medication Sig Dispense Refill  . Acetaminophen (TYLENOL 8 HOUR PO) Take by mouth as needed.    . conjugated estrogens (PREMARIN) vaginal cream apply 1/2 gram vaginally TWICE A WEEK 30 g 5  . Cyanocobalamin (VITAMIN B 12) 100 MCG LOZG Take by mouth daily.    . Ibuprofen (ADVIL) 200 MG CAPS Take by mouth as needed.    . lidocaine (XYLOCAINE) 5 % ointment Apply topically as directed (Patient not taking: Reported on 11/16/2015) 1.25 g 1   No current facility-administered medications for this  visit.    Family History  Problem Relation Age of Onset  . Breast cancer Mother 15    deceased age 24 from blood clot in lung secondary complications in breast cancer treatment  . Heart disease Paternal Grandfather   . Bladder Cancer Maternal Grandfather   . Osteoporosis Maternal Grandmother   . Rheum arthritis Maternal Grandmother     ROS:  Pertinent items are noted in HPI.  Otherwise, a comprehensive ROS was negative.  Exam:   BP 144/90 mmHg  Pulse 88  Resp 16  Ht 5' 0.75" (1.543 m)  Wt 112 lb (50.803 kg)  BMI 21.34 kg/m2  LMP 10/16/2006  Weight change: -3#   Height: 5' 0.75" (154.3 cm)  Ht Readings from Last 3 Encounters:  11/16/15 5' 0.75" (1.543 m)  09/14/14 5' 1"  (1.549 m)  08/14/13 5' 1"  (1.549 m)    General appearance: alert, cooperative and appears stated age Head: Normocephalic, without obvious abnormality, atraumatic Neck: no adenopathy, supple, symmetrical, trachea midline and thyroid normal to inspection and palpation Lungs: clear to auscultation bilaterally Breasts: normal appearance, no masses or tenderness Heart: rate is regular however there are occasional skipped beats on exam. Listened for over 2 minutes and noted at least 5-6 skipped beats.  Abdomen: soft, non-tender; bowel sounds normal; no masses,  no organomegaly  Extremities: extremities normal, atraumatic, no cyanosis or edema Skin: Skin color, texture, turgor normal. No rashes or lesions Lymph nodes: Cervical, supraclavicular, and axillary nodes normal. No abnormal inguinal nodes palpated Neurologic: Grossly normal   Pelvic: External genitalia:  no lesions              Urethra:  normal appearing urethra with no masses, tenderness or lesions              Bartholins and Skenes: normal                 Vagina: normal appearing vagina with normal color and discharge, no lesions              Cervix: no lesions              Pap taken: Yes.   Bimanual Exam:  Uterus:  normal size, contour, position,  consistency, mobility, non-tender              Adnexa: normal adnexa and no mass, fullness, tenderness               Rectovaginal: Confirms               Anus:  normal sphincter tone, no lesions  Chaperone was present for exam.  A:  Well Woman with normal exam PMP, no HRT. Mother with hx of breast cancer, BRCA 1/2 negative 10/13. MRI every other year. Scheduled for 11/29/15. Skipped beats on cardiac exam.  H/O mitral valve prolapse.  P: Mammogram. Doing 3D yearly. MRI every other year. pap smear obtained today. Pt aware of guidelines and desires yearly.  HPV obtained this year as well  Continue premarin vaginal cream 1/2 gram pv twice weekly.  Rx to pharmacy. CMP, Lipids, TSH, Vit D Hep C ab pending Cardiology referral return annually or prn

## 2015-11-17 LAB — HEPATITIS C ANTIBODY: HCV AB: NEGATIVE

## 2015-11-17 LAB — TSH: TSH: 2.537 u[IU]/mL (ref 0.350–4.500)

## 2015-11-17 LAB — VITAMIN D 25 HYDROXY (VIT D DEFICIENCY, FRACTURES): VIT D 25 HYDROXY: 26 ng/mL — AB (ref 30–100)

## 2015-11-17 LAB — IPS PAP TEST WITH REFLEX TO HPV

## 2015-11-18 LAB — HEMOGLOBIN, FINGERSTICK: Hemoglobin, fingerstick: 14.2 g/dL (ref 12.0–16.0)

## 2015-11-25 ENCOUNTER — Telehealth: Payer: Self-pay | Admitting: Obstetrics & Gynecology

## 2015-11-25 ENCOUNTER — Ambulatory Visit
Admission: RE | Admit: 2015-11-25 | Discharge: 2015-11-25 | Disposition: A | Discharge status: Home / Self Care | Payer: 59 | Source: Ambulatory Visit | Attending: Obstetrics & Gynecology | Admitting: Obstetrics & Gynecology

## 2015-11-25 DIAGNOSIS — Z1239 Encounter for other screening for malignant neoplasm of breast: Secondary | ICD-10-CM

## 2015-11-25 NOTE — Telephone Encounter (Signed)
Called, spoke with the patient and relayed the following referral information:  Appointment with cardiologist:   Dr. Jenkins Rouge [address] [place]  [place], [place] [address] [phone]  Date: 12/10/15 to arrive at 9:30 AM for a 10:00 AM appointment.  Patient expressed understanding of the appointment details and appreciation for the referral. 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 Kyla Balzarine office to reschedule.

## 2015-11-29 ENCOUNTER — Ambulatory Visit
Admission: RE | Admit: 2015-11-29 | Discharge: 2015-11-29 | Disposition: A | Discharge status: Home / Self Care | Payer: 59 | Source: Ambulatory Visit | Attending: Obstetrics & Gynecology | Admitting: Obstetrics & Gynecology

## 2015-11-29 DIAGNOSIS — R922 Inconclusive mammogram: Secondary | ICD-10-CM

## 2015-11-29 DIAGNOSIS — Z803 Family history of malignant neoplasm of breast: Secondary | ICD-10-CM

## 2015-11-29 MED ORDER — GADOBENATE DIMEGLUMINE 529 MG/ML IV SOLN
10.0000 mL | Freq: Once | INTRAVENOUS | Status: AC | PRN
  Administered 2015-11-29: 10 mL via INTRAVENOUS

## 2015-12-02 NOTE — Telephone Encounter (Signed)
Okay to close encounter or is further follow up needed? °

## 2015-12-03 NOTE — Telephone Encounter (Signed)
Ok to close encounter.  Pt having skipped beats on physical exam but she is not symptomatic for this.

## 2015-12-04 ENCOUNTER — Encounter: Payer: Self-pay | Admitting: Obstetrics & Gynecology

## 2015-12-06 ENCOUNTER — Telehealth: Payer: Self-pay | Admitting: Emergency Medicine

## 2015-12-06 NOTE — Telephone Encounter (Signed)
-----   Message from Megan Salon, MD sent at 12/04/2015  7:31 AM EST ----- Regarding: MRi Breast MRI was negative.  Out of MMG hold/recall this year if still in it.

## 2015-12-06 NOTE — Telephone Encounter (Signed)
Out of hold per Dr. Miller.   

## 2015-12-10 ENCOUNTER — Ambulatory Visit: Payer: 59 | Admitting: Cardiovascular Disease

## 2015-12-15 ENCOUNTER — Telehealth: Payer: Self-pay | Admitting: Obstetrics & Gynecology

## 2015-12-15 NOTE — Telephone Encounter (Addendum)
Patient rescheduled appointment from Dr. Johnsie Cancel to Dr. Acie Fredrickson. Appointment is 12/16/15. Changed referral to reflect physician change. Patient notified.

## 2015-12-15 NOTE — Telephone Encounter (Signed)
Patient was referred to a cardiologist by Dr.Miller. Patient was able to get an appointment earlier at a different office. Patient is asking that the referral be changed to the doctor she will be seeing tomorrow.

## 2015-12-16 ENCOUNTER — Encounter: Payer: Self-pay | Admitting: Cardiovascular Disease

## 2015-12-16 ENCOUNTER — Ambulatory Visit (INDEPENDENT_AMBULATORY_CARE_PROVIDER_SITE_OTHER): Payer: 59 | Admitting: Cardiovascular Disease

## 2015-12-16 VITALS — BP 140/90 | HR 136 | Ht 60.0 in | Wt 112.0 lb

## 2015-12-16 DIAGNOSIS — Z86018 Personal history of other benign neoplasm: Secondary | ICD-10-CM

## 2015-12-16 DIAGNOSIS — R002 Palpitations: Secondary | ICD-10-CM | POA: Diagnosis not present

## 2015-12-16 DIAGNOSIS — R000 Tachycardia, unspecified: Secondary | ICD-10-CM | POA: Diagnosis not present

## 2015-12-16 DIAGNOSIS — I10 Essential (primary) hypertension: Secondary | ICD-10-CM

## 2015-12-16 DIAGNOSIS — D3500 Benign neoplasm of unspecified adrenal gland: Secondary | ICD-10-CM

## 2015-12-16 DIAGNOSIS — Z8639 Personal history of other endocrine, nutritional and metabolic disease: Secondary | ICD-10-CM

## 2015-12-16 MED ORDER — METOPROLOL TARTRATE 25 MG PO TABS: 12.5000 mg | ORAL_TABLET | Freq: Two times a day (BID) | ORAL | Status: DC

## 2015-12-16 NOTE — Patient Instructions (Addendum)
Medication Instructions:  START Metoprolol 12.5 mg twice daily - take 12 hours apart - DO NOT START UNTIL AFTER URINE TEST   Labwork: Your physician recommends that you return for lab work - we will call you regarding the urine test   Testing/Procedures: Your physician has requested that you have an echocardiogram approximately (at least 1 week from 12/16/15). Echocardiography is a painless test that uses sound waves to create images of your heart. It provides your doctor with information about the size and shape of your heart and how well your heart's chambers and valves are working. This procedure takes approximately one hour. There are no restrictions for this procedure.   Follow-Up: Your physician recommends that you schedule a follow-up appointment in: 2-3 weeks with Dr. Acie Fredrickson - March 23 at 4:30   If you need a refill on your cardiac medications before your next appointment, please call your pharmacy.   Thank you for choosing [facility]! Christen Bame, RN [phone]

## 2015-12-16 NOTE — Progress Notes (Addendum)
Cardiology Office Note   Date:  12/16/2015   ID:  Sheila Mcbride, DOB May 20, 1961, MRN [id number]  PCP:  Chesley Noon, MD  Cardiologist:   Thayer Headings, MD   Problem List 1. PVCs 2. Sinus tachycardia 3.  Pheochromocytoma - resected at [facility] ~ Jan. 1990 4. cholecystectomy 5. Mitral valve prolapse 6.   Chief Complaint  Patient presents with  . New Patient (Initial Visit)    NO COMPLAINTS. NO CP, SOB, OR SWELLING  . Tachycardia  . Palpitations  . Hypertension      History of Present Illness: Sheila Mcbride is a 55 y.o. female who presents for evaluation of HR irregularities. She could not feel the palpitations  . Since her doctor told her about then PVC's, she now has become very aware of them Hx of Pheochromocytoma Gets very anxious when she comes to the doctor,  BP and HR typically increase Today she had normal CP and HR Here in the office , HR and BP are  markedly elevated.   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   Feels better after exercising .   No limitations with her exercise.   Hx of MVP diagnosed 20+ years ago .       Past Medical History  Diagnosis Date  . H/O pheochromocytoma   . Rash     all over body in 03/13/? etiology  . MVP (mitral valve prolapse)   . H/O [facility] spotted fever   . TMJ (temporomandibular joint syndrome)     Past Surgical History  Procedure Laterality Date  . Pheochromocytoma      removal in  2000  . Cholecystectomy      and exploratory surgery in pelvic area  . Wisdom tooth extraction    . Dilation and curettage of uterus  6/04     Current Outpatient Prescriptions  Medication Sig Dispense Refill  . Acetaminophen (TYLENOL 8 HOUR PO) Take 2 capsules by mouth as needed (AS NEEDED FOR HEADACHE).     Marland Kitchen conjugated estrogens (PREMARIN) vaginal cream apply 1/2  gram vaginally TWICE A WEEK 30 g 5  . Cyanocobalamin (VITAMIN B 12) 100 MCG LOZG Take 1 tablet by mouth daily.     . Ibuprofen (ADVIL) 200 MG CAPS Take 2 capsules by mouth as needed (AS NEEDED FOR MODERATE PAIN).     Marland Kitchen lidocaine (XYLOCAINE) 5 % ointment Apply topically as directed (Patient taking differently: Apply 1 application topically daily as needed for mild pain or moderate pain. Apply topically as directed) 1.25 g 1  . metoprolol tartrate (LOPRESSOR) 25 MG tablet Take 0.5 tablets (12.5 mg total) by mouth 2 (two) times daily. 30 tablet 11   No current facility-administered medications for this visit.    Allergies:   Sulfa antibiotics and Epinephrine    Social History:  The patient  reports that she has never smoked. She has never used smokeless tobacco. She reports that she does not drink alcohol or use illicit drugs.   Family History:  The patient's family history includes Bladder Cancer in her maternal grandfather; Breast cancer (age of onset: 22) in her mother; Heart disease in her paternal grandfather; Osteoporosis in her maternal grandmother; Rheum arthritis in her maternal grandmother.    ROS:  Please see the history of present illness.  Review of Systems: Constitutional:  denies fever, chills, diaphoresis, appetite change and fatigue.  HEENT: denies photophobia, eye pain, redness, hearing loss, ear pain, congestion, sore throat, rhinorrhea, sneezing, neck pain, neck stiffness and tinnitus.  Respiratory: denies SOB, DOE, cough, chest tightness, and wheezing.  Cardiovascular: denies chest pain, palpitations and leg swelling.  Gastrointestinal: denies nausea, vomiting, abdominal pain, diarrhea, constipation, blood in stool.  Genitourinary: denies dysuria, urgency, frequency, hematuria, flank pain and difficulty urinating.  Musculoskeletal: denies  myalgias, back pain, joint swelling, arthralgias and gait problem.   Skin: denies pallor, rash and wound.  Neurological: denies  dizziness, seizures, syncope, weakness, light-headedness, numbness and headaches.   Hematological: denies adenopathy, easy bruising, personal or family bleeding history.  Psychiatric/ Behavioral: denies suicidal ideation, mood changes, confusion, nervousness, sleep disturbance and agitation.       All other systems are reviewed and negative.    PHYSICAL EXAM: VS:  BP 140/90 mmHg  Pulse 136  Ht 5' (1.524 m)  Wt 112 lb (50.803 kg)  BMI 21.87 kg/m2  LMP 10/16/2006 , BMI Body mass index is 21.87 kg/(m^2). GEN: Well nourished, well developed, in no acute distress HEENT: normal Neck: no JVD, carotid bruits, or masses Cardiac: RRR; no murmurs, rubs, or gallops,no edema  Respiratory:  clear to auscultation bilaterally, normal work of breathing GI: soft, nontender, nondistended, + BS MS: no deformity or atrophy Skin: warm and dry, no rash Neuro:  Strength and sensation are intact Psych: normal   EKG:  EKG is ordered today. The ekg ordered today demonstrates  Sinus tachycardia 136. She has occasional premature ventricular contractions.   Recent Labs: 11/16/2015: ALT 13; BUN 12; Creat 0.73; Potassium 4.0; Sodium 139; TSH 2.537    Lipid Panel    Component Value Date/Time   CHOL 178 11/16/2015 1613   TRIG 64 11/16/2015 1613   HDL 80 11/16/2015 1613   CHOLHDL 2.2 11/16/2015 1613   VLDL 13 11/16/2015 1613   LDLCALC 85 11/16/2015 1613      Wt Readings from Last 3 Encounters:  12/16/15 112 lb (50.803 kg)  11/16/15 112 lb (50.803 kg)  09/14/14 115 lb (52.164 kg)      Other studies Reviewed: Additional studies/ records that were reviewed today include: . Review of the above records demonstrates:    ASSESSMENT AND PLAN:  1.  Hx of Pheo:  Pheo has a 16% recurrence rate.  She is very tachycardic today .  Also has HTN She says that her BP and HR are very normal most of the time Gets very anxious often.    Denies any weight loss.    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    Will repeat a 24 hr urine for Epinephrine, total catacholamine Plasma fractionated metanephrines.  AS recommended by Up To Date.... She will need to be started on alpha blockade initially followed by beta blockade  Following the 24 hr urine, We will start Cardura 1 mg  .   She should measure her BP twice a day Increase Cardura up to 8 mg a day as tolerated  She should start a high salt diet ( up to 5 gm a day) to avoid orthostasis Once she is adequately beta blocked, will start Propranolol 10 QID  If she tolerates that we will change the propranolol to metoprolol   She will need an MRI of the abdomen if the 24 hr urine is abnormal She will also need yearly screening  Will start her on metoprolol 12.5 BID  after the collection of the 24 hr urine.  Will get an echo in about 1 week .      I'll see her in about 2-3 weeks.    Current medicines are reviewed at length with the patient today.  The patient does not have concerns regarding medicines.  The following changes have been made: .   Labs/ tests ordered today include: 24 hr urine for total catacholamines, VMA, plasma fractionated metanephrines.       Orders Placed This Encounter  Procedures  . EKG 12-Lead  . Echocardiogram   Disposition:   FU with me in 2-3 weeks  Nahser, Wonda Cheng, MD  12/16/2015 5:55 PM    [address], [place], [place]  [address] Phone: [phone]; Fax: [phone]   Addendum After review of the literature , will change some aspects of our management.

## 2015-12-17 ENCOUNTER — Other Ambulatory Visit: Payer: Self-pay | Admitting: *Deleted

## 2015-12-17 ENCOUNTER — Encounter: Payer: Self-pay | Admitting: *Deleted

## 2015-12-17 ENCOUNTER — Encounter: Payer: Self-pay | Admitting: Cardiovascular Disease

## 2015-12-17 DIAGNOSIS — D3500 Benign neoplasm of unspecified adrenal gland: Secondary | ICD-10-CM

## 2015-12-17 MED ORDER — PROPRANOLOL HCL 10 MG PO TABS: 10.0000 mg | ORAL_TABLET | ORAL | Status: DC

## 2015-12-17 MED ORDER — DOXAZOSIN MESYLATE 1 MG PO TABS: 1.0000 mg | ORAL_TABLET | Freq: Every day | ORAL | Status: DC

## 2015-12-18 ENCOUNTER — Other Ambulatory Visit: Payer: Self-pay | Admitting: Cardiovascular Disease

## 2015-12-18 NOTE — Addendum Note (Signed)
Addended by: Thayer Headings on: 12/18/2015 01:19 PM   Modules accepted: Orders

## 2015-12-20 ENCOUNTER — Other Ambulatory Visit: Payer: Self-pay | Admitting: Nurse Practitioner

## 2015-12-20 ENCOUNTER — Other Ambulatory Visit (HOSPITAL_COMMUNITY)
Admission: RE | Admit: 2015-12-20 | Discharge: 2015-12-20 | Disposition: A | Discharge status: Home / Self Care | Payer: 59 | Source: Ambulatory Visit | Attending: Cardiovascular Disease | Admitting: Cardiovascular Disease

## 2015-12-20 ENCOUNTER — Encounter: Payer: Self-pay | Admitting: Cardiovascular Disease

## 2015-12-20 DIAGNOSIS — D3500 Benign neoplasm of unspecified adrenal gland: Secondary | ICD-10-CM | POA: Diagnosis present

## 2015-12-20 LAB — CREATININE, URINE, 24 HOUR
COLLECTION INTERVAL-UCRE24: 24 h
CREATININE 24H UR: 952 mg/d (ref 600–1800)
CREATININE, URINE: 38.84 mg/dL
URINE TOTAL VOLUME-UCRE24: 2450 mL

## 2015-12-23 LAB — METANEPHRINES, URINE, 24 HOUR
METANEPHRINES 24H UR: 37 ug/(24.h) — AB (ref 45–290)
Metaneph Total, Ur: 15 ug/L
Normetanephrine, 24H Ur: 255 ug/24 hr (ref 82–500)
Normetanephrine, Ur: 104 ug/L
Total Volume: 2450

## 2015-12-23 LAB — CATECHOLAMINES,UR.,FREE,24 HR
DOPAMINE, UR, 24HR: 167 ug/(24.h) (ref 0–510)
Dopamine, Rand Ur: 68 ug/L
EPINEPHRINE, U, 24HR: 5 ug/(24.h) (ref 0–20)
Epinephrine, Rand Ur: 2 ug/L
NOREPINEPH RAND UR: 21 ug/L
Norepinephrine,U,24H: 51 ug/24 hr (ref 0–135)
Total Volume: 2450

## 2015-12-29 ENCOUNTER — Ambulatory Visit: Payer: 59 | Admitting: Cardiovascular Disease

## 2015-12-30 ENCOUNTER — Encounter: Payer: Self-pay | Admitting: Cardiovascular Disease

## 2015-12-30 ENCOUNTER — Other Ambulatory Visit: Payer: Self-pay

## 2015-12-30 ENCOUNTER — Ambulatory Visit (HOSPITAL_COMMUNITY): Payer: 59 | Attending: Cardiology

## 2015-12-30 DIAGNOSIS — R002 Palpitations: Secondary | ICD-10-CM

## 2015-12-30 DIAGNOSIS — I340 Nonrheumatic mitral (valve) insufficiency: Secondary | ICD-10-CM | POA: Diagnosis not present

## 2015-12-30 DIAGNOSIS — R000 Tachycardia, unspecified: Secondary | ICD-10-CM | POA: Diagnosis not present

## 2015-12-30 DIAGNOSIS — I10 Essential (primary) hypertension: Secondary | ICD-10-CM

## 2016-01-06 ENCOUNTER — Ambulatory Visit: Payer: 59 | Admitting: Cardiovascular Disease

## 2016-01-19 ENCOUNTER — Encounter: Payer: Self-pay | Admitting: Cardiovascular Disease

## 2016-01-19 ENCOUNTER — Ambulatory Visit (INDEPENDENT_AMBULATORY_CARE_PROVIDER_SITE_OTHER): Payer: 59 | Admitting: Cardiovascular Disease

## 2016-01-19 VITALS — BP 154/84 | HR 118 | Ht 60.0 in | Wt 112.8 lb

## 2016-01-19 DIAGNOSIS — R000 Tachycardia, unspecified: Secondary | ICD-10-CM | POA: Diagnosis not present

## 2016-01-19 NOTE — Progress Notes (Signed)
Cardiology Office Note   Date:  01/19/2016   ID:  [name], DOB 1961-02-02, MRN [id number]  PCP:  Chesley Noon, MD  Cardiologist:   Thayer Headings, MD   Problem List 1. PVCs 2. Sinus tachycardia 3.  Pheochromocytoma - resected at [facility] ~ Jan. 1990 4. cholecystectomy 5. Mitral valve prolapse 6.   Chief Complaint  Patient presents with  . Follow-up      History of Present Illness: Sheila Mcbride is a 55 y.o. female who presents for evaluation of HR irregularities. She could not feel the palpitations  . Since her doctor told her about then PVC's, she now has become very aware of them Hx of Pheochromocytoma Gets very anxious when she comes to the doctor,  BP and HR typically increase Today she had normal CP and HR Here in the office , HR and BP are  markedly elevated.   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   Feels better after exercising .   No limitations with her exercise.   Hx of MVP diagnosed 20+ years ago .     January 19, 2016:  Has done better.  24 hr urine did not show any suggestion of Pheochromocytoma.   She has remained very active. Has no difficulty doing her normal exercises.   Past Medical History  Diagnosis Date  . H/O pheochromocytoma   . Rash     all over body in 03/13/? etiology  . MVP (mitral valve prolapse)   . H/O [facility] spotted fever   . TMJ (temporomandibular joint syndrome)     Past Surgical History  Procedure Laterality Date  . Pheochromocytoma      removal in  2000  . Cholecystectomy      and exploratory surgery in pelvic area  . Wisdom tooth extraction    . Dilation and curettage of uterus  6/04     Current Outpatient Prescriptions  Medication Sig Dispense Refill  . Acetaminophen (TYLENOL 8 HOUR PO) Take 2 capsules by mouth as needed (AS NEEDED FOR  HEADACHE).     Marland Kitchen conjugated estrogens (PREMARIN) vaginal cream apply 1/2 gram vaginally TWICE A WEEK 30 g 5  . Cyanocobalamin (VITAMIN B 12) 100 MCG LOZG Take 1 tablet by mouth daily.     Marland Kitchen doxazosin (CARDURA) 1 MG tablet Take 1 tablet (1 mg total) by mouth daily. 30 tablet 6  . Ibuprofen (ADVIL) 200 MG CAPS Take 2 capsules by mouth as needed (AS NEEDED FOR MODERATE PAIN).     Marland Kitchen lidocaine (XYLOCAINE) 5 % ointment Apply topically as directed (Patient taking differently: Apply 1 application topically daily as needed for mild pain or moderate pain. Apply topically as directed) 1.25 g 1  . metoprolol tartrate (LOPRESSOR) 25 MG tablet Take 0.5 tablets (12.5 mg total) by mouth 2 (two) times daily. (Patient not taking: Reported on 01/19/2016) 30 tablet 11  . propranolol (INDERAL) 10 MG tablet Take 1 tablet (10 mg total) by mouth as directed. Every 6 hours as needed for fast heart rate (Patient not taking: Reported on 01/19/2016) 30 tablet 2   No current facility-administered medications for this visit.    Allergies:   Sulfa antibiotics and Epinephrine    Social History:  The patient  reports that she has never smoked. She has never used smokeless  tobacco. She reports that she does not drink alcohol or use illicit drugs.   Family History:  The patient's family history includes Bladder Cancer in her maternal grandfather; Breast cancer (age of onset: 75) in her mother; Heart disease in her paternal grandfather; Osteoporosis in her maternal grandmother; Rheum arthritis in her maternal grandmother.    ROS:  Please see the history of present illness.    Review of Systems: Constitutional:  denies fever, chills, diaphoresis, appetite change and fatigue.  HEENT: denies photophobia, eye pain, redness, hearing loss, ear pain, congestion, sore throat, rhinorrhea, sneezing, neck pain, neck stiffness and tinnitus.  Respiratory: denies SOB, DOE, cough, chest tightness, and wheezing.  Cardiovascular: denies chest  pain, palpitations and leg swelling.  Gastrointestinal: denies nausea, vomiting, abdominal pain, diarrhea, constipation, blood in stool.  Genitourinary: denies dysuria, urgency, frequency, hematuria, flank pain and difficulty urinating.  Musculoskeletal: denies  myalgias, back pain, joint swelling, arthralgias and gait problem.   Skin: denies pallor, rash and wound.  Neurological: denies dizziness, seizures, syncope, weakness, light-headedness, numbness and headaches.   Hematological: denies adenopathy, easy bruising, personal or family bleeding history.  Psychiatric/ Behavioral: denies suicidal ideation, mood changes, confusion, nervousness, sleep disturbance and agitation.       All other systems are reviewed and negative.    PHYSICAL EXAM: VS:  BP 154/84 mmHg  Pulse 118  Ht 5' (1.524 m)  Wt 112 lb 12.8 oz (51.166 kg)  BMI 22.03 kg/m2  LMP 10/16/2006 , BMI Body mass index is 22.03 kg/(m^2). GEN: Well nourished, well developed, in no acute distress HEENT: normal Neck: no JVD, carotid bruits, or masses Cardiac: RRR; no murmurs, rubs, or gallops,no edema  Respiratory:  clear to auscultation bilaterally, normal work of breathing GI: soft, nontender, nondistended, + BS MS: no deformity or atrophy Skin: warm and dry, no rash Neuro:  Strength and sensation are intact Psych: normal   EKG:  EKG is ordered today. The ekg ordered today demonstrates  Sinus tachycardia 118 She has occasional premature ventricular contractions. NS ST changes - persistent .   Recent Labs: 11/16/2015: ALT 13; BUN 12; Creat 0.73; Potassium 4.0; Sodium 139; TSH 2.537    Lipid Panel    Component Value Date/Time   CHOL 178 11/16/2015 1613   TRIG 64 11/16/2015 1613   HDL 80 11/16/2015 1613   CHOLHDL 2.2 11/16/2015 1613   VLDL 13 11/16/2015 1613   LDLCALC 85 11/16/2015 1613      Wt Readings from Last 3 Encounters:  01/19/16 112 lb 12.8 oz (51.166 kg)  12/16/15 112 lb (50.803 kg)  11/16/15 112 lb  (50.803 kg)      Other studies Reviewed: Additional studies/ records that were reviewed today include: . Review of the above records demonstrates:    ASSESSMENT AND PLAN:  1.  Hx of Pheo:  Pheo has a 16% recurrence rate.  Has responded nicely to the cardura Has not tried the propranolol  I'm pleased that her 24-hour urine did not reveal any evidence of pheochromocytoma. We will continue to check her yearly for this.  2. Hypertension: Her blood pressure is fairly well-controlled.  She continues to have lots of anxiety related hypertension and tachycardia.   I have encouraged her to take propanolol as needed.  3. 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 Her original EKG showed ST segment depression but this was in the setting of significant hypertension and sinus tachycardia. She has persistent ST segment changes that I think need further evaluation.  We'll schedule her for a [employer] study.  Because she gets so nervous and her heart rate goes up so quickly. I have recommended that she take appropriate panel tablet 30 minute before coming to her appointment for her nuclear study.  4. Sinus tachycardia: Her heart rate at home seems to be normal. She gets quite anxious and her heart rate goes up whenever she comes to the doctor. I have encouraged her to use the propranolol as needed.  I'll see her back in 6 month.    Nahser, Wonda Cheng, MD  01/19/2016 6:28 PM    [place] [place],  [place], [place]  [address] Pager [phone] Phone: [phone]; Fax: [phone]   [facility]  [address] [place], [place]  [address] [phone]   Fax [phone]

## 2016-01-19 NOTE — Patient Instructions (Signed)
Medication Instructions:  Your physician recommends that you continue on your current medications as directed. Please refer to the Current Medication list given to you today.   Labwork: None Ordered   Testing/Procedures: Your physician has requested that you have a lexiscan myoview. For further information please visit [email or web address]. Please follow instruction sheet, as given.   Follow-Up: Your physician wants you to follow-up in: 6 months with Dr. Nahser.  You will receive a reminder letter in the mail two months in advance. If you don't receive a letter, please call our office to schedule the follow-up appointment.   If you need a refill on your cardiac medications before your next appointment, please call your pharmacy.   Thank you for choosing [facility]! Michelle Swinyer, RN [phone]    

## 2016-01-20 ENCOUNTER — Ambulatory Visit (HOSPITAL_COMMUNITY): Payer: 59 | Attending: Cardiology

## 2016-01-20 DIAGNOSIS — R002 Palpitations: Secondary | ICD-10-CM | POA: Insufficient documentation

## 2016-01-20 DIAGNOSIS — R9431 Abnormal electrocardiogram [ECG] [EKG]: Secondary | ICD-10-CM | POA: Insufficient documentation

## 2016-01-20 DIAGNOSIS — R9439 Abnormal result of other cardiovascular function study: Secondary | ICD-10-CM | POA: Diagnosis not present

## 2016-01-20 DIAGNOSIS — R000 Tachycardia, unspecified: Secondary | ICD-10-CM | POA: Diagnosis not present

## 2016-01-20 LAB — MYOCARDIAL PERFUSION IMAGING
CHL CUP NUCLEAR SSS: 3
CSEPPHR: 121 {beats}/min
LHR: 0.28
LV sys vol: 44 mL
LVDIAVOL: 91 mL (ref 46–106)
Rest HR: 91 {beats}/min
SDS: 0
SRS: 3
TID: 0.95

## 2016-01-20 MED ORDER — REGADENOSON 0.4 MG/5ML IV SOLN
0.4000 mg | Freq: Once | INTRAVENOUS | Status: AC
  Administered 2016-01-20: 0.4 mg via INTRAVENOUS

## 2016-01-20 MED ORDER — TECHNETIUM TC 99M SESTAMIBI GENERIC - CARDIOLITE
32.8000 | Freq: Once | INTRAVENOUS | Status: AC | PRN
  Administered 2016-01-20: 32.8 via INTRAVENOUS

## 2016-01-20 MED ORDER — TECHNETIUM TC 99M SESTAMIBI GENERIC - CARDIOLITE
10.1000 | Freq: Once | INTRAVENOUS | Status: AC | PRN
  Administered 2016-01-20: 10.1 via INTRAVENOUS

## 2016-03-15 ENCOUNTER — Encounter: Payer: Self-pay | Admitting: Obstetrics & Gynecology

## 2016-11-20 ENCOUNTER — Other Ambulatory Visit: Payer: Self-pay | Admitting: Obstetrics & Gynecology

## 2016-11-20 DIAGNOSIS — Z1231 Encounter for screening mammogram for malignant neoplasm of breast: Secondary | ICD-10-CM

## 2016-12-07 ENCOUNTER — Ambulatory Visit
Admission: RE | Admit: 2016-12-07 | Discharge: 2016-12-07 | Disposition: A | Discharge status: Home / Self Care | Payer: 59 | Source: Ambulatory Visit | Attending: Obstetrics & Gynecology | Admitting: Obstetrics & Gynecology

## 2016-12-07 DIAGNOSIS — Z1231 Encounter for screening mammogram for malignant neoplasm of breast: Secondary | ICD-10-CM

## 2017-02-05 ENCOUNTER — Other Ambulatory Visit (HOSPITAL_COMMUNITY)
Admission: RE | Admit: 2017-02-05 | Discharge: 2017-02-05 | Disposition: A | Discharge status: Home / Self Care | Payer: 59 | Source: Ambulatory Visit | Attending: Obstetrics & Gynecology | Admitting: Obstetrics & Gynecology

## 2017-02-05 ENCOUNTER — Ambulatory Visit (INDEPENDENT_AMBULATORY_CARE_PROVIDER_SITE_OTHER): Payer: 59 | Admitting: Obstetrics & Gynecology

## 2017-02-05 ENCOUNTER — Encounter: Payer: Self-pay | Admitting: Obstetrics & Gynecology

## 2017-02-05 VITALS — BP 148/76 | HR 80 | Resp 16 | Ht 61.0 in | Wt 113.0 lb

## 2017-02-05 DIAGNOSIS — Z01419 Encounter for gynecological examination (general) (routine) without abnormal findings: Secondary | ICD-10-CM | POA: Diagnosis not present

## 2017-02-05 DIAGNOSIS — Z124 Encounter for screening for malignant neoplasm of cervix: Secondary | ICD-10-CM | POA: Insufficient documentation

## 2017-02-05 DIAGNOSIS — Z0000 Encounter for general adult medical examination without abnormal findings: Secondary | ICD-10-CM | POA: Diagnosis not present

## 2017-02-05 LAB — COMPREHENSIVE METABOLIC PANEL
ALBUMIN: 4.3 g/dL (ref 3.6–5.1)
ALK PHOS: 66 U/L (ref 33–130)
ALT: 15 U/L (ref 6–29)
AST: 24 U/L (ref 10–35)
BILIRUBIN TOTAL: 0.6 mg/dL (ref 0.2–1.2)
BUN: 15 mg/dL (ref 7–25)
CHLORIDE: 104 mmol/L (ref 98–110)
CO2: 28 mmol/L (ref 20–31)
CREATININE: 0.78 mg/dL (ref 0.50–1.05)
Calcium: 9.8 mg/dL (ref 8.6–10.4)
Glucose, Bld: 74 mg/dL (ref 65–99)
Potassium: 4.5 mmol/L (ref 3.5–5.3)
SODIUM: 140 mmol/L (ref 135–146)
TOTAL PROTEIN: 6.9 g/dL (ref 6.1–8.1)

## 2017-02-05 LAB — CBC
HCT: 42.7 % (ref 35.0–45.0)
HEMOGLOBIN: 13.7 g/dL (ref 11.7–15.5)
MCH: 30.1 pg (ref 27.0–33.0)
MCHC: 32.1 g/dL (ref 32.0–36.0)
MCV: 93.8 fL (ref 80.0–100.0)
MPV: 10.1 fL (ref 7.5–12.5)
Platelets: 320 10*3/uL (ref 140–400)
RBC: 4.55 MIL/uL (ref 3.80–5.10)
RDW: 13 % (ref 11.0–15.0)
WBC: 6.6 10*3/uL (ref 3.8–10.8)

## 2017-02-05 LAB — TSH: TSH: 2.2 m[IU]/L

## 2017-02-05 LAB — HEMOGLOBIN, FINGERSTICK: HEMOGLOBIN, FINGERSTICK: 13.3 g/dL (ref 12.0–15.0)

## 2017-02-05 MED ORDER — LIDOCAINE 5 % EX OINT: 1.0000 "application " | TOPICAL_OINTMENT | Freq: Every day | CUTANEOUS | 0 refills | Status: DC | PRN

## 2017-02-05 MED ORDER — ESTROGENS, CONJUGATED 0.625 MG/GM VA CREA: TOPICAL_CREAM | VAGINAL | 5 refills | Status: DC

## 2017-02-05 NOTE — Progress Notes (Addendum)
56 y.o. G2P2 MarriedCaucasianF here for annual exam.  Saw Dr. Cathie Olden for palpitations.  Has sinus tachycardia and PVCs on evaluation.  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  He recommended pt continue to have yearly testing.  She does not feel the need for this.  She did have an EKG and echocardiogram done as well.  Denies vaginal bleeding.    Son is going to be doing fellowship in pulmonary critical care.  Son in Sports coach has graduating from med school in May.  He is doing ENT.    Patient's last menstrual period was 10/16/2006.          Sexually active: Yes.    The current method of family planning is post menopausal status.    Exercising: Yes.    aerobics, resistance training  Smoker:  no  Health Maintenance: Pap:  11/16/15 Neg   09/14/14 Neg History of abnormal Pap:  Yes, h/o atypical endocervical cells 6/11, 2012 MMG:  12/07/16 BIRADS1:Neg, MRI 11/29/15 Colonoscopy:  09/06/12 F/u 10 years  BMD:   09/22/10 Normal  TDaP:  06/2012  Pneumonia vaccine(s):  No Zostavax:   No Hep C testing: 11/16/15 Neg  Screening Labs: Here, Hb today: 13.3   reports that she has never smoked. She has never used smokeless tobacco. She reports that she does not drink alcohol or use drugs.  Past Medical History:  Diagnosis Date  . H/O pheochromocytoma   . H/O [facility] spotted fever   . MVP (mitral valve prolapse)   . Rash    all over body in 03/13/? etiology  . TMJ (temporomandibular joint syndrome)     Past Surgical History:  Procedure Laterality Date  . CHOLECYSTECTOMY     and exploratory surgery in pelvic area  . DILATION AND CURETTAGE OF UTERUS  6/04  . pheochromocytoma     removal in  2000  . WISDOM TOOTH EXTRACTION      Current Outpatient Prescriptions  Medication Sig Dispense Refill  . Acetaminophen (TYLENOL 8 HOUR PO) Take 2 capsules by mouth as needed (AS NEEDED FOR HEADACHE).     Marland Kitchen conjugated estrogens (PREMARIN) vaginal cream apply 1/2  gram vaginally TWICE A WEEK 30 g 5  . Cyanocobalamin (VITAMIN B 12) 100 MCG LOZG Take 1 tablet by mouth daily.     Marland Kitchen doxazosin (CARDURA) 1 MG tablet Take 1 tablet (1 mg total) by mouth daily. 30 tablet 6  . Ibuprofen (ADVIL) 200 MG CAPS Take 2 capsules by mouth as needed (AS NEEDED FOR MODERATE PAIN).     Marland Kitchen lidocaine (XYLOCAINE) 5 % ointment Apply topically as directed (Patient taking differently: Apply 1 application topically daily as needed for mild pain or moderate pain. Apply topically as directed) 1.25 g 1  . metoprolol tartrate (LOPRESSOR) 25 MG tablet Take 0.5 tablets (12.5 mg total) by mouth 2 (two) times daily. 30 tablet 11  . propranolol (INDERAL) 10 MG tablet Take 1 tablet (10 mg total) by mouth as directed. Every 6 hours as needed for fast heart rate 30 tablet 2   No current facility-administered medications for this visit.     Family History  Problem Relation Age of Onset  . Breast cancer Mother 54    deceased age 18 from blood clot in lung secondary complications in breast cancer treatment  . Heart disease Paternal Grandfather   . Osteoporosis Maternal Grandmother   . Rheum arthritis Maternal Grandmother   . Bladder Cancer Maternal  Grandfather     ROS:  Pertinent items are noted in HPI.  Otherwise, a comprehensive ROS was negative.  Exam:   BP (!) 148/76 (BP Location: Right Arm, Patient Position: Sitting, Cuff Size: Normal)   Pulse 80   Resp 16   Ht 5' 1"  (1.549 m)   Wt 113 lb (51.3 kg)   LMP 10/16/2006   BMI 21.35 kg/m     Height: 5' 1"  (154.9 cm)  Ht Readings from Last 3 Encounters:  02/05/17 5' 1"  (1.549 m)  01/19/16 5' (1.524 m)  12/16/15 5' (1.524 m)    General appearance: alert, cooperative and appears stated age Head: Normocephalic, without obvious abnormality, atraumatic Neck: no adenopathy, supple, symmetrical, trachea midline and thyroid normal to inspection and palpation Lungs: clear to auscultation bilaterally Breasts: normal appearance, no masses  or tenderness Heart: regular rate and rhythm Abdomen: soft, non-tender; bowel sounds normal; no masses,  no organomegaly Extremities: extremities normal, atraumatic, no cyanosis or edema Skin: Skin color, texture, turgor normal. No rashes or lesions Lymph nodes: Cervical, supraclavicular, and axillary nodes normal. No abnormal inguinal nodes palpated Neurologic: Grossly normal   Pelvic: External genitalia:  no lesions              Urethra:  normal appearing urethra with no masses, tenderness or lesions              Bartholins and Skenes: normal                 Vagina: normal appearing vagina with normal color and discharge, no lesions              Cervix: no lesions              Pap taken: Yes.   Bimanual Exam:  Uterus:  normal size, contour, position, consistency, mobility, non-tender              Adnexa: normal adnexa and no mass, fullness, tenderness               Rectovaginal: Confirms               Anus:  normal sphincter tone, no lesions  Chaperone was present for exam.  A:  Well Woman with normal exam PMP, no HRT Family hx of breast cancer age 68.  BRCA 1/2 negative 10/13.  Tyrer-Cusick calculation for lifetime risk if 23.6% (done today)  P:   Mammogram yearly with every other year MRI.  Will transition to yearly MRIs.   pap smear with HR HPV obtained today CBC, CMP, Lipids, Vit D Premarin vaginal cream 1/2 pv twice weekly.  #42.5 gm/3RF RF for Lidocaine 5%, small amt externally with intercourse.  Rx to pharmacy. Return annually or prn

## 2017-02-06 ENCOUNTER — Telehealth: Payer: Self-pay

## 2017-02-06 ENCOUNTER — Encounter: Payer: Self-pay | Admitting: Obstetrics & Gynecology

## 2017-02-06 DIAGNOSIS — Z9189 Other specified personal risk factors, not elsewhere classified: Secondary | ICD-10-CM

## 2017-02-06 LAB — CYTOLOGY - PAP
Diagnosis: NEGATIVE
HPV: NOT DETECTED

## 2017-02-06 LAB — VITAMIN D 25 HYDROXY (VIT D DEFICIENCY, FRACTURES): VIT D 25 HYDROXY: 31 ng/mL (ref 30–100)

## 2017-02-06 NOTE — Telephone Encounter (Signed)
Patient has a family hx of breast cancer age 56.  BRCA 1/2 negative 10/13.  Tyrer-Cusick calculation for lifetime risk if 23.6%. Order for bilateral breast MRI w wo contrast placed for precert and scheduling per Dr.Miller's request.  Routing to [employer] for precert.

## 2017-02-07 NOTE — Telephone Encounter (Signed)
Patient is scheduled for a bilateral breast MRI w/wo contrast,  on 05/28/17 with [facility]. Will obtain prior approval for the MRI closer to the appointment time, as approvals are usually valid for thirty days.  Routing to Dr Sabra Heck for review  cc: Verline Lema Sprague

## 2017-02-07 NOTE — Telephone Encounter (Signed)
I have placed the patient in imaging hold.

## 2017-02-20 ENCOUNTER — Other Ambulatory Visit: Payer: 59

## 2017-03-06 ENCOUNTER — Ambulatory Visit: Payer: 59 | Admitting: Obstetrics & Gynecology

## 2017-05-28 ENCOUNTER — Ambulatory Visit
Admission: RE | Admit: 2017-05-28 | Discharge: 2017-05-28 | Disposition: A | Discharge status: Home / Self Care | Payer: 59 | Source: Ambulatory Visit | Attending: Obstetrics & Gynecology | Admitting: Obstetrics & Gynecology

## 2017-05-28 DIAGNOSIS — Z9189 Other specified personal risk factors, not elsewhere classified: Secondary | ICD-10-CM

## 2017-05-28 MED ORDER — GADOBENATE DIMEGLUMINE 529 MG/ML IV SOLN: 9.0000 mL | Freq: Once | INTRAVENOUS | Status: DC | PRN

## 2017-05-29 ENCOUNTER — Telehealth: Payer: Self-pay

## 2017-05-29 NOTE — Telephone Encounter (Signed)
-----   Message from Megan Salon, MD sent at 05/29/2017 12:15 AM EDT ----- Please let pt know her breast MRI is negative.  When I saw her last we discussed her hx of pheochromocytoma.  She was recommended to have yearly testing.  Let her know I did not forget about this but have not gotten any clear answers due to differing opions.  May be beneficial for her to see an endocrinologist once for official recommendations.  Would she be ok with this?  I will refer if she is.  Thanks.

## 2017-05-29 NOTE — Telephone Encounter (Signed)
Spoke with patient. Advised of message and results as seen below from [place]. Patient verbalizes understanding. Patient declines referral at this time. States she has an Musician in her family and would like to speak with them about recommendations. Will return call for referral if needed.  Routing to provider for final review. Patient agreeable to disposition. Will close encounter.

## 2017-11-27 ENCOUNTER — Other Ambulatory Visit: Payer: Self-pay | Admitting: Obstetrics & Gynecology

## 2017-11-27 DIAGNOSIS — Z1231 Encounter for screening mammogram for malignant neoplasm of breast: Secondary | ICD-10-CM

## 2017-12-14 ENCOUNTER — Ambulatory Visit: Payer: 59

## 2017-12-17 ENCOUNTER — Ambulatory Visit
Admission: RE | Admit: 2017-12-17 | Discharge: 2017-12-17 | Disposition: A | Discharge status: Home / Self Care | Payer: 59 | Source: Ambulatory Visit | Attending: Obstetrics & Gynecology | Admitting: Obstetrics & Gynecology

## 2017-12-17 DIAGNOSIS — Z1231 Encounter for screening mammogram for malignant neoplasm of breast: Secondary | ICD-10-CM

## 2018-01-14 DIAGNOSIS — S62357D Nondisplaced fracture of shaft of fifth metacarpal bone, left hand, subsequent encounter for fracture with routine healing: Secondary | ICD-10-CM | POA: Insufficient documentation

## 2018-03-18 ENCOUNTER — Other Ambulatory Visit: Payer: Self-pay | Admitting: Obstetrics & Gynecology

## 2018-03-19 NOTE — Telephone Encounter (Signed)
Medication refill request: premarin  Last AEX:  02/05/17 SM  Next AEX: 04/23/18  Last MMG (if hormonal medication request): 12/17/17 BIRADS 1 negative  Refill authorized: 02/05/17 #30g, 5RF. Today, please advise.

## 2018-04-23 ENCOUNTER — Encounter: Payer: Self-pay | Admitting: Obstetrics & Gynecology

## 2018-04-23 ENCOUNTER — Other Ambulatory Visit: Payer: Self-pay | Admitting: *Deleted

## 2018-04-23 ENCOUNTER — Encounter

## 2018-04-23 ENCOUNTER — Ambulatory Visit (INDEPENDENT_AMBULATORY_CARE_PROVIDER_SITE_OTHER): Payer: 59 | Admitting: Obstetrics & Gynecology

## 2018-04-23 ENCOUNTER — Other Ambulatory Visit: Payer: Self-pay

## 2018-04-23 VITALS — BP 158/92 | HR 96 | Resp 14 | Ht 61.0 in | Wt 111.0 lb

## 2018-04-23 DIAGNOSIS — Z0000 Encounter for general adult medical examination without abnormal findings: Secondary | ICD-10-CM | POA: Diagnosis not present

## 2018-04-23 DIAGNOSIS — E2839 Other primary ovarian failure: Secondary | ICD-10-CM

## 2018-04-23 DIAGNOSIS — Z1231 Encounter for screening mammogram for malignant neoplasm of breast: Secondary | ICD-10-CM

## 2018-04-23 DIAGNOSIS — Z01419 Encounter for gynecological examination (general) (routine) without abnormal findings: Secondary | ICD-10-CM

## 2018-04-23 DIAGNOSIS — Z9189 Other specified personal risk factors, not elsewhere classified: Secondary | ICD-10-CM

## 2018-04-23 MED ORDER — RANITIDINE HCL 150 MG PO TABS: 150.0000 mg | ORAL_TABLET | Freq: Two times a day (BID) | ORAL | 1 refills | Status: DC

## 2018-04-23 MED ORDER — METOPROLOL TARTRATE 25 MG PO TABS: 12.5000 mg | ORAL_TABLET | Freq: Two times a day (BID) | ORAL | 3 refills | Status: DC

## 2018-04-23 MED ORDER — ESTROGENS, CONJUGATED 0.625 MG/GM VA CREA: TOPICAL_CREAM | VAGINAL | 2 refills | Status: DC

## 2018-04-23 NOTE — Patient Instructions (Addendum)
Try and remember to schedule the bone density with your mammogram in March, 2020.   Zantac 150mg  twice daily for two weeks.  Please let me know if you still have symptoms in two weeks.

## 2018-04-23 NOTE — Progress Notes (Signed)
57 y.o. G2P2 MarriedCaucasianF here for annual exam.  Broke her right finger after slipping on the floor.  Does not have any deficits.   Denies vaginal bleeding.     Patient's last menstrual period was 10/16/2006.          Sexually active: Yes.    The current method of family planning is post menopausal status.    Exercising: Yes.    HIIT, strength training  Smoker:  no  Health Maintenance: Pap:  02/05/17 Neg. HR HPV:neg   11/16/15 Neg  History of abnormal Pap:  Yes, atypical endocervical cells 2011, 2012 MMG:  12/17/17 BIRADS1:Neg  Colonoscopy:  09/06/12 Normal.  BMD:   09/22/10 Normal  TDaP:  2013 Pneumonia vaccine(s):  n/a Shingrix:   Reviewed Shingrix vaccine with pt.   Hep C testing: 11/16/15 Neg  Screening Labs: Here today   reports that she has never smoked. She has never used smokeless tobacco. She reports that she does not drink alcohol or use drugs.  Past Medical History:  Diagnosis Date  . H/O pheochromocytoma   . H/O [facility] spotted fever   . MVP (mitral valve prolapse)   . Rash    all over body in 03/13/? etiology  . TMJ (temporomandibular joint syndrome)     Past Surgical History:  Procedure Laterality Date  . CHOLECYSTECTOMY     and exploratory surgery in pelvic area  . DILATION AND CURETTAGE OF UTERUS  6/04  . pheochromocytoma     removal in  2000  . WISDOM TOOTH EXTRACTION      Current Outpatient Medications  Medication Sig Dispense Refill  . Acetaminophen (TYLENOL 8 HOUR PO) Take 2 capsules by mouth as needed (AS NEEDED FOR HEADACHE).     Marland Kitchen conjugated estrogens (PREMARIN) vaginal cream Place 0.5 g vaginally 2 (two) times a week.    . Cyanocobalamin (VITAMIN B 12) 100 MCG LOZG Take 1 tablet by mouth daily.     . Ibuprofen (ADVIL) 200 MG CAPS Take 2 capsules by mouth as needed (AS NEEDED FOR MODERATE PAIN).     Marland Kitchen lidocaine (XYLOCAINE) 5 % ointment Apply 1 application topically daily as needed for mild pain or moderate pain. Apply topically as  directed 30 g 0  . metoprolol tartrate (LOPRESSOR) 25 MG tablet Take 0.5 tablets (12.5 mg total) by mouth 2 (two) times daily. 30 tablet 11   No current facility-administered medications for this visit.     Family History  Problem Relation Age of Onset  . Breast cancer Mother 3       deceased age 80 from blood clot in lung secondary complications in breast cancer treatment  . Heart disease Paternal Grandfather   . Osteoporosis Maternal Grandmother   . Rheum arthritis Maternal Grandmother   . Bladder Cancer Maternal Grandfather     Review of Systems  All other systems reviewed and are negative.   Exam:   BP (!) 158/92 (BP Location: Left Arm, Patient Position: Sitting, Cuff Size: Normal)   Pulse 96   Resp 14   Ht '5\' 1"'$  (1.549 m)   Wt 111 lb (50.3 kg)   LMP 10/16/2006   BMI 20.97 kg/m    Height: '5\' 1"'$  (154.9 cm)  Ht Readings from Last 3 Encounters:  04/23/18 '5\' 1"'$  (1.549 m)  02/05/17 '5\' 1"'$  (1.549 m)  01/19/16 5' (1.524 m)    General appearance: alert, cooperative and appears stated age Head: Normocephalic, without obvious abnormality, atraumatic Neck: no adenopathy, supple, symmetrical,  trachea midline and thyroid normal to inspection and palpation Lungs: clear to auscultation bilaterally Breasts: normal appearance, no masses or tenderness Heart: regular rate and rhythm Abdomen: soft, non-tender; bowel sounds normal; no masses,  no organomegaly Extremities: extremities normal, atraumatic, no cyanosis or edema Skin: Skin color, texture, turgor normal. No rashes or lesions Lymph nodes: Cervical, supraclavicular, and axillary nodes normal. No abnormal inguinal nodes palpated Neurologic: Grossly normal   Pelvic: External genitalia:  no lesions              Urethra:  normal appearing urethra with no masses, tenderness or lesions              Bartholins and Skenes: normal                 Vagina: normal appearing vagina with normal color and discharge, no lesions               Cervix: no lesions              Pap taken: No. Bimanual Exam:  Uterus:  normal size, contour, position, consistency, mobility, non-tender              Adnexa: normal adnexa and no mass, fullness, tenderness               Rectovaginal: Confirms               Anus:  normal sphincter tone, no lesions  Chaperone was present for exam.  A:  Well Woman with normal exam PMP, no HRT Vaginal  Family hx of breast cancer age 60.  BRCA 1/2 negative 10/13.  Tyrer-Cusick risk calculation showed increased risk for breast cancer at 23.6%. Reflux symptoms  P:   Doing yearly 3D MMG with yearly breast MRI as well pap smear with neg HR HPV 2018.  Not obtained today CBC, CMP, Lipids, TSH obtained today RF for Premarin vaginal cream 1/2 gram pv twice weekly.  #30gm/2RF Does not need Xylocaine RF Zantac 121m bid x 2 weeks.  Pt will call if has symptoms in two weeks. return annually or prn

## 2018-04-24 LAB — LIPID PANEL
CHOLESTEROL TOTAL: 202 mg/dL — AB (ref 100–199)
Chol/HDL Ratio: 2.5 ratio (ref 0.0–4.4)
HDL: 80 mg/dL (ref >39)
LDL CALC: 109 mg/dL — AB (ref 0–99)
TRIGLYCERIDES: 64 mg/dL (ref 0–149)
VLDL Cholesterol Cal: 13 mg/dL (ref 5–40)

## 2018-04-24 LAB — TSH: TSH: 3 u[IU]/mL (ref 0.450–4.500)

## 2018-04-24 LAB — CBC
HEMOGLOBIN: 14.4 g/dL (ref 11.1–15.9)
Hematocrit: 43.8 % (ref 34.0–46.6)
MCH: 30.1 pg (ref 26.6–33.0)
MCHC: 32.9 g/dL (ref 31.5–35.7)
MCV: 92 fL (ref 79–97)
Platelets: 362 10*3/uL (ref 150–450)
RBC: 4.78 x10E6/uL (ref 3.77–5.28)
RDW: 13.1 % (ref 12.3–15.4)
WBC: 6.9 10*3/uL (ref 3.4–10.8)

## 2018-04-24 LAB — COMPREHENSIVE METABOLIC PANEL
ALK PHOS: 78 IU/L (ref 39–117)
ALT: 21 IU/L (ref 0–32)
AST: 31 IU/L (ref 0–40)
Albumin/Globulin Ratio: 2 (ref 1.2–2.2)
Albumin: 4.7 g/dL (ref 3.5–5.5)
BUN/Creatinine Ratio: 14 (ref 9–23)
BUN: 12 mg/dL (ref 6–24)
Bilirubin Total: 0.7 mg/dL (ref 0.0–1.2)
CO2: 26 mmol/L (ref 20–29)
CREATININE: 0.83 mg/dL (ref 0.57–1.00)
Calcium: 9.8 mg/dL (ref 8.7–10.2)
Chloride: 101 mmol/L (ref 96–106)
GFR calc Af Amer: 91 mL/min/{1.73_m2} (ref >59)
GFR calc non Af Amer: 79 mL/min/{1.73_m2} (ref >59)
GLOBULIN, TOTAL: 2.4 g/dL (ref 1.5–4.5)
GLUCOSE: 89 mg/dL (ref 65–99)
Potassium: 4.2 mmol/L (ref 3.5–5.2)
SODIUM: 141 mmol/L (ref 134–144)
Total Protein: 7.1 g/dL (ref 6.0–8.5)

## 2018-04-26 ENCOUNTER — Telehealth: Payer: Self-pay | Admitting: Obstetrics & Gynecology

## 2018-04-26 NOTE — Telephone Encounter (Signed)
Call to patient. Advised results pending but not to be alarmed. Will call her back as soon as possible.

## 2018-04-26 NOTE — Telephone Encounter (Signed)
Patient asking for test results from annual exam DOS 04/23/18.

## 2018-04-26 NOTE — Telephone Encounter (Signed)
Called pt personally and advised of results.  Ok to close encounter.

## 2018-05-13 ENCOUNTER — Telehealth: Payer: Self-pay | Admitting: Obstetrics & Gynecology

## 2018-05-13 NOTE — Telephone Encounter (Signed)
Patient is calling to give an update after taking Zantac for two weeks. Patient is still having pain.

## 2018-05-13 NOTE — Telephone Encounter (Signed)
Spoke with patient. Calling with update. Started zantac BID on 04/23/18. Pain is better, not completely resolved. Has occasional upper abdominal burning, not occuring daily. Patient will continue medication, advised will review with Dr. Sabra Heck and return call with any additional recommendations.   Dr. Sabra Heck -please review.

## 2018-05-13 NOTE — Telephone Encounter (Signed)
I actually did a review about this and it is appropriate to treat with prilosec 40mg  for two months, then refer if not resolved.  This is stronger than Zantac so I'd like her to switch.  Since she's been on this for two weeks, I'd just use the prilosec for 1 month.  She can then either follow up or call with update.  I will refer at that time if not fully resolved.  Thanks.

## 2018-05-14 MED ORDER — OMEPRAZOLE 40 MG PO CPDR: 40.0000 mg | DELAYED_RELEASE_CAPSULE | Freq: Every day | ORAL | 0 refills | Status: DC

## 2018-05-14 NOTE — Telephone Encounter (Signed)
Spoke with patient, advised as seen below per Dr. Sabra Heck. Rx for Prilosec sent to verified pharmacy. Patient will call with update in 1 month. Patient verbalizes understanding.  Routing to provider for final review. Patient is agreeable to disposition. Will close encounter.

## 2018-06-10 ENCOUNTER — Encounter: Payer: Self-pay | Admitting: Gastroenterology

## 2018-06-10 ENCOUNTER — Telehealth: Payer: Self-pay | Admitting: *Deleted

## 2018-06-10 DIAGNOSIS — K219 Gastro-esophageal reflux disease without esophagitis: Secondary | ICD-10-CM

## 2018-06-10 MED ORDER — OMEPRAZOLE 40 MG PO CPDR: 40.0000 mg | DELAYED_RELEASE_CAPSULE | Freq: Every day | ORAL | 0 refills | Status: DC

## 2018-06-10 NOTE — Telephone Encounter (Signed)
Spoke with patient.  States the Prilosec has helped a lot but still having some reflux issues with spicy foods, tomatoes and stress.    Requests refills on Prilosec if Dr. Sabra Heck agrees.

## 2018-06-10 NOTE — Telephone Encounter (Signed)
Referral placed for Dr. Silverio Decamp and refill sent.  Pt notified.  Encounter closed.

## 2018-06-10 NOTE — Telephone Encounter (Signed)
Patient wanting to let nurse know how she is doing after taking omeprazole. She is going out of town and only has 1 pill left.

## 2018-06-10 NOTE — Telephone Encounter (Signed)
That is fine to RF the prilosec.  I think we also discussed referral to GI if the prilosec did not fully resolve the problem.  OK to proceed with this.  Thanks.

## 2018-06-25 ENCOUNTER — Ambulatory Visit
Admission: RE | Admit: 2018-06-25 | Discharge: 2018-06-25 | Disposition: A | Discharge status: Home / Self Care | Payer: 59 | Source: Ambulatory Visit | Attending: Obstetrics & Gynecology | Admitting: Obstetrics & Gynecology

## 2018-06-25 DIAGNOSIS — Z9189 Other specified personal risk factors, not elsewhere classified: Secondary | ICD-10-CM

## 2018-06-25 MED ORDER — GADOBENATE DIMEGLUMINE 529 MG/ML IV SOLN
10.0000 mL | Freq: Once | INTRAVENOUS | Status: AC | PRN
  Administered 2018-06-25: 10 mL via INTRAVENOUS

## 2018-07-29 ENCOUNTER — Ambulatory Visit: Payer: 59 | Admitting: Gastroenterology

## 2018-07-29 ENCOUNTER — Encounter: Payer: Self-pay | Admitting: Gastroenterology

## 2018-07-29 ENCOUNTER — Encounter

## 2018-07-29 VITALS — BP 144/82 | HR 90 | Ht 61.0 in | Wt 111.1 lb

## 2018-07-29 DIAGNOSIS — R1013 Epigastric pain: Secondary | ICD-10-CM | POA: Diagnosis not present

## 2018-07-29 DIAGNOSIS — K219 Gastro-esophageal reflux disease without esophagitis: Secondary | ICD-10-CM

## 2018-07-29 DIAGNOSIS — E739 Lactose intolerance, unspecified: Secondary | ICD-10-CM | POA: Diagnosis not present

## 2018-07-29 MED ORDER — OMEPRAZOLE 20 MG PO CPDR: 20.0000 mg | DELAYED_RELEASE_CAPSULE | Freq: Every day | ORAL | 3 refills | Status: DC

## 2018-07-29 NOTE — Patient Instructions (Signed)
You have been scheduled for an endoscopy. Please follow written instructions given to you at your visit today. If you use inhalers (even only as needed), please bring them with you on the day of your procedure. Your physician has requested that you go to www.startemmi.com and enter the access code given to you at your visit today. This web site gives a general overview about your procedure. However, you should still follow specific instructions given to you by our office regarding your preparation for the procedure.  We have sent Omeprazole 20 mg to your pharmacy  Use IBGard 1 capsule three times a day as needed  Follow up in 6 months  Thank you for choosing [facility]  Karleen Hampshire Nandigam,MD

## 2018-07-29 NOTE — Progress Notes (Signed)
Sheila Mcbride    [id number]    [birth year]  Primary Care Physician:Badger, Rebeca Alert, MD  Referring Physician: Chesley Noon, [place], [place] [address]  Chief complaint: Heartburn  HPI: 57 year old female here for new patient visit with complaints of epigastric and upper abdomen discomfort radiating to the back.  Her symptoms started in June, somewhat better since she started taking omeprazole daily but has not completely resolved.  She was initially prescribed Pepcid for 2 weeks, given persistent symptoms with switch to omeprazole daily.  She is currently taking 20 mg twice daily with persistent intermittent symptoms. Denies any association with diet or activity.  She had similar symptoms in 1994, was treated for an ulcer empirically and her symptoms have improved at the time.  Never had an EGD. She had gallbladder surgery about 25 years ago.  Reviewed labs in epic 06/2018: CBC, BMP, LFT within normal limits.  Total cholesterol elevated at 202. Has 1-2 bowel movements daily, sometimes has loose watery stool intermittently worse when she is anxious.  Colonoscopy by Dr. Olevia Perches September 06, 2012 showed pancolonic diverticulosis otherwise normal exam  Had normal LV function on echocardiogram March 2017.  Also had nuclear stress test April 2017 negative for ischemia, was overall low risk scan   Outpatient Encounter Medications as of 07/29/2018  Medication Sig  . Acetaminophen (TYLENOL 8 HOUR PO) Take 2 capsules by mouth as needed (AS NEEDED FOR HEADACHE).   Marland Kitchen conjugated estrogens (PREMARIN) vaginal cream Place 1/2 gram pv twice weekly  . Cyanocobalamin (VITAMIN B 12) 100 MCG LOZG Take 1 tablet by mouth daily.   . Ibuprofen (ADVIL) 200 MG CAPS Take 2 capsules by mouth as needed (AS NEEDED FOR MODERATE PAIN).   Marland Kitchen lidocaine (XYLOCAINE) 5 % ointment Apply 1 application topically daily as needed for mild pain or moderate pain. Apply topically as directed    . metoprolol tartrate (LOPRESSOR) 25 MG tablet Take 0.5 tablets (12.5 mg total) by mouth 2 (two) times daily. (Patient taking differently: Take 12.5 mg by mouth 2 (two) times daily as needed. )  . omeprazole (PRILOSEC) 40 MG capsule Take 1 capsule (40 mg total) by mouth daily.   No facility-administered encounter medications on file as of 07/29/2018.     Allergies as of 07/29/2018 - Review Complete 07/29/2018  Allergen Reaction Noted  . Sulfa antibiotics Hives 08/20/2012  . Epinephrine Palpitations 12/16/2015    Past Medical History:  Diagnosis Date  . H/O pheochromocytoma   . H/O [facility] spotted fever   . MVP (mitral valve prolapse)   . Rash    all over body in 03/13/? etiology  . TMJ (temporomandibular joint syndrome)     Past Surgical History:  Procedure Laterality Date  . CHOLECYSTECTOMY     and exploratory surgery in pelvic area  . DILATION AND CURETTAGE OF UTERUS  6/04  . pheochromocytoma     removal in  2000  . WISDOM TOOTH EXTRACTION      Family History  Problem Relation Age of Onset  . Breast cancer Mother 45       deceased age 71 from blood clot in lung secondary complications in breast cancer treatment  . Heart disease Paternal Grandfather   . Osteoporosis Maternal Grandmother   . Rheum arthritis Maternal Grandmother   . Bladder Cancer Maternal Grandfather     Social History   Socioeconomic History  . Marital status: Married  Spouse name: Not on file  . Number of children: Not on file  . Years of education: Not on file  . Highest education level: Not on file  Occupational History  . Not on file  Social Needs  . Financial resource strain: Not on file  . Food insecurity:    Worry: Not on file    Inability: Not on file  . Transportation needs:    Medical: Not on file    Non-medical: Not on file  Tobacco Use  . Smoking status: Never Smoker  . Smokeless tobacco: Never Used  Substance and Sexual Activity  . Alcohol use: No  . Drug use:  No  . Sexual activity: Yes    Partners: Male    Birth control/protection: Other-see comments, Post-menopausal    Comment: vasectomy  Lifestyle  . Physical activity:    Days per week: Not on file    Minutes per session: Not on file  . Stress: Not on file  Relationships  . Social connections:    Talks on phone: Not on file    Gets together: Not on file    Attends religious service: Not on file    Active member of club or organization: Not on file    Attends meetings of clubs or organizations: Not on file    Relationship status: Not on file  . Intimate partner violence:    Fear of current or ex partner: Not on file    Emotionally abused: Not on file    Physically abused: Not on file    Forced sexual activity: Not on file  Other Topics Concern  . Not on file  Social History Narrative  . Not on file      Review of systems: Review of Systems  Constitutional: Negative for fever and chills.  HENT: Negative.   Eyes: Negative for blurred vision.  Respiratory: Negative for cough, shortness of breath and wheezing.   Cardiovascular: Negative for chest pain and palpitations.  Gastrointestinal: as per HPI Genitourinary: Negative for dysuria, urgency, frequency and hematuria.  Musculoskeletal: Negative for myalgias, back pain and joint pain.  Skin: Negative for itching and rash.  Neurological: Negative for dizziness, tremors, focal weakness, seizures and loss of consciousness.  Endo/Heme/Allergies: Positive for seasonal allergies.  Psychiatric/Behavioral: Negative for depression, suicidal ideas and hallucinations.  All other systems reviewed and are negative.   Physical Exam: Vitals:   07/29/18 1323  BP: (!) 144/82  Pulse: 90   Body mass index is 21 kg/m. Gen:      No acute distress HEENT:  EOMI, sclera anicteric Neck:     No masses; no thyromegaly Lungs:    Clear to auscultation bilaterally; normal respiratory effort CV:         Regular rate and rhythm; no murmurs Abd:        + bowel sounds; soft, non-tender; no palpable masses, no distension Ext:    No edema; adequate peripheral perfusion Skin:      Warm and dry; no rash Neuro: alert and oriented x 3 Psych: normal mood and affect  Data Reviewed:   Reviewed labs, radiology imaging, old records and pertinent past GI work up   Assessment and Plan/Recommendations:  57 year old female with history of epigastric abdominal pain radiating to the back for past 3 to 4 months No improvement despite daily PPI Will schedule for EGD for further evaluation, to exclude peptic ulcer disease, severe esophagitis or gastritis.  We will also obtain biopsies to exclude H. Pylori. Continue  omeprazole 20 mg daily, 30 minutes before breakfast Antireflux measures and lifestyle changes We will do trial of IBgard 1 capsule up to 3 times daily Intermittent diarrhea likely secondary to lactose intolerance: Trial of lactose-free diet for 1 to 2 weeks  The risks and benefits as well as alternatives of endoscopic procedure(s) have been discussed and reviewed. All questions answered. The patient agrees to proceed.  Damaris Hippo , MD [phone]    CC: Chesley Noon, MD

## 2018-07-30 ENCOUNTER — Encounter: Payer: Self-pay | Admitting: Gastroenterology

## 2018-07-30 ENCOUNTER — Ambulatory Visit (AMBULATORY_SURGERY_CENTER): Payer: 59 | Admitting: Gastroenterology

## 2018-07-30 VITALS — BP 105/56 | HR 95 | Temp 98.6°F | Resp 16 | Ht 61.0 in | Wt 111.0 lb

## 2018-07-30 DIAGNOSIS — K449 Diaphragmatic hernia without obstruction or gangrene: Secondary | ICD-10-CM

## 2018-07-30 DIAGNOSIS — K2970 Gastritis, unspecified, without bleeding: Secondary | ICD-10-CM | POA: Diagnosis not present

## 2018-07-30 DIAGNOSIS — K219 Gastro-esophageal reflux disease without esophagitis: Secondary | ICD-10-CM | POA: Diagnosis not present

## 2018-07-30 DIAGNOSIS — Q396 Congenital diverticulum of esophagus: Secondary | ICD-10-CM | POA: Diagnosis not present

## 2018-07-30 MED ORDER — SODIUM CHLORIDE 0.9 % IV SOLN: 500.0000 mL | Freq: Once | INTRAVENOUS | Status: DC

## 2018-07-30 NOTE — Progress Notes (Signed)
To PACU, VSS. Report to [email or web address].tb 

## 2018-07-30 NOTE — Patient Instructions (Signed)
YOU HAD AN ENDOSCOPIC PROCEDURE TODAY AT [facility]:   Refer to the procedure report that was given to you for any specific questions about what was found during the examination.  If the procedure report does not answer your questions, please call your gastroenterologist to clarify.  If you requested that your care partner not be given the details of your procedure findings, then the procedure report has been included in a sealed envelope for you to review at your convenience later.  YOU SHOULD EXPECT: Some feelings of bloating in the abdomen. Passage of more gas than usual.  Walking can help get rid of the air that was put into your GI tract during the procedure and reduce the bloating.   Please Note:  You might notice some irritation and congestion in your nose or some drainage.  This is from the oxygen used during your procedure.  There is no need for concern and it should clear up in a day or so.  SYMPTOMS TO REPORT IMMEDIATELY:    Following upper endoscopy (EGD)  Vomiting of blood or coffee ground material  New chest pain or pain under the shoulder blades  Painful or persistently difficult swallowing  New shortness of breath  Fever of 100F or higher  Black, tarry-looking stools   For urgent or emergent issues, a gastroenterologist can be reached at any hour by calling [phone]. Avoid NSAIDS: aspsirin, aleve, ibuprofen until further notice.    DIET:  We do recommend a small meal at first, but then you may proceed to your regular diet.  Drink plenty of fluids but you should avoid alcoholic beverages for 24 hours.  Avoid citrus juices or other acidic foods, alcohol,chocolate, mints coffee, carbonated beverages, fatty or fired foods. See handout for details.  ACTIVITY:  You should plan to take it easy for the rest of today and you should NOT DRIVE or use heavy machinery until tomorrow (because of the sedation medicines used during the test).    FOLLOW UP: Our  staff will call the number listed on your records the next business day following your procedure to check on you and address any questions or concerns that you may have regarding the information given to you following your procedure. If we do not reach you, we will leave a message.  However, if you are feeling well and you are not experiencing any problems, there is no need to return our call.  We will assume that you have returned to your regular daily activities without incident.  If any biopsies were taken you will be contacted by phone or by letter within the next 1-3 weeks.  Please call us at [phone] if you have not heard about the biopsies in 3 weeks.    SIGNATURES/CONFIDENTIALITY: You and/or your care partner have signed paperwork which will be entered into your electronic medical record.  These signatures attest to the fact that that the information above on your After Visit Summary has been reviewed and is understood.  Full responsibility of the confidentiality of this discharge information lies with you and/or your care-partner.  Read all of the handouts given to you by your recovery room nurse.  The office will all you to schedule the upper gi series.

## 2018-07-30 NOTE — Op Note (Signed)
[place] Patient Name: Sheila Mcbride Procedure Date: 07/30/2018 8:36 AM MRN: [id number] Endoscopist: Mauri Pole , MD Age: 57 Referring MD:  Date of Birth: 1961/02/22 Gender: Female Account #: [id number] Procedure:                Upper GI endoscopy Indications:              Epigastric abdominal pain, Upper abdominal pain,                            Esophageal reflux symptoms that persist despite                            appropriate therapy Medicines:                Monitored Anesthesia Care Procedure:                Pre-Anesthesia Assessment:                           - Prior to the procedure, a History and Physical                            was performed, and patient medications and                            allergies were reviewed. The patient's tolerance of                            previous anesthesia was also reviewed. The risks                            and benefits of the procedure and the sedation                            options and risks were discussed with the patient.                            All questions were answered, and informed consent                            was obtained. Prior Anticoagulants: The patient has                            taken no previous anticoagulant or antiplatelet                            agents. ASA Grade Assessment: II - A patient with                            mild systemic disease. After reviewing the risks                            and benefits, the patient was deemed in  satisfactory condition to undergo the procedure.                           After obtaining informed consent, the endoscope was                            passed under direct vision. Throughout the                            procedure, the patient's blood pressure, pulse, and                            oxygen saturations were monitored continuously. The                            Endoscope was introduced  through the mouth, and                            advanced to the second part of duodenum. The upper                            GI endoscopy was accomplished without difficulty.                            The patient tolerated the procedure well. Scope In: Scope Out: Findings:                 The Z-line was regular and was found 35 cm from the                            incisors. Small diverticula in the lower esophagus                           A large hiatal hernia was found. The proximal                            extent of the gastric folds (end of tubular                            esophagus) was 37 cm from the incisors. The hiatal                            narrowing was 45 cm from the incisors. The Z-line                            was 35 cm from the incisors.                           Patchy mild inflammation characterized by                            congestion (edema) and erythema was found in the  entire examined stomach. Biopsies were taken with a                            cold forceps for histology. Biopsies were taken                            with a cold forceps for Helicobacter pylori testing.                           The examined duodenum was normal. Complications:            No immediate complications. Estimated Blood Loss:     Estimated blood loss was minimal. Impression:               - Z-line regular, 35 cm from the incisors.                           - Large hiatal hernia. Small lower esophageal                            diverticula                           - Atrophic gastritis. Biopsied.                           - Normal examined duodenum. Recommendation:           - Patient has a contact number available for                            emergencies. The signs and symptoms of potential                            delayed complications were discussed with the                            patient. Return to normal activities tomorrow.                             Written discharge instructions were provided to the                            patient.                           - Resume previous diet.                           - Continue present medications.                           - Await pathology results.                           - No ibuprofen, naproxen, or other non-steroidal  anti-inflammatory drugs.                           - Follow an antireflux regimen. This includes:                           - Do not lie down for at least 3 to 4 hours after                            meals.                           - Raise the head of the bed 4 to 6 inches.                           - Decrease excess weight.                           - Avoid citrus juices and other acidic foods,                            alcohol, chocolate, mints, coffee and other                            caffeinated beverages, carbonated beverages, fatty                            and fried foods.                           - Avoid tight-fitting clothing.                           - Avoid cigarettes and other tobacco products.                           - Do an upper GI series at appointment to be                            scheduled. Mauri Pole, MD 07/30/2018 8:57:14 AM This report has been signed electronically.

## 2018-07-30 NOTE — Progress Notes (Signed)
Called to room to assist during endoscopic procedure.  Patient ID and intended procedure confirmed with present staff. Received instructions for my participation in the procedure from the performing physician.  

## 2018-07-30 NOTE — Progress Notes (Signed)
Pt's states no medical or surgical changes since previsit or office visit. 

## 2018-07-31 ENCOUNTER — Telehealth: Payer: Self-pay | Admitting: *Deleted

## 2018-07-31 ENCOUNTER — Other Ambulatory Visit: Payer: Self-pay

## 2018-07-31 DIAGNOSIS — R1013 Epigastric pain: Secondary | ICD-10-CM

## 2018-07-31 DIAGNOSIS — K219 Gastro-esophageal reflux disease without esophagitis: Secondary | ICD-10-CM

## 2018-07-31 NOTE — Telephone Encounter (Signed)
  Follow up Call-  Call back number 07/30/2018  Post procedure Call Back phone  # [phone]  Permission to leave phone message Yes  Some recent data might be hidden     Patient questions:  Do you have a fever, pain , or abdominal swelling? No. Pain Score  0 *  Have you tolerated food without any problems? Yes.    Have you been able to return to your normal activities? Yes.    Do you have any questions about your discharge instructions: Diet   No. Medications  No. Follow up visit  No.  Do you have questions or concerns about your Care? No.  Actions: * If pain score is 4 or above: No action needed, pain <4.

## 2018-08-12 ENCOUNTER — Encounter: Payer: Self-pay | Admitting: Gastroenterology

## 2018-08-13 ENCOUNTER — Ambulatory Visit (HOSPITAL_COMMUNITY)
Admission: RE | Admit: 2018-08-13 | Discharge: 2018-08-13 | Disposition: A | Discharge status: Home / Self Care | Payer: 59 | Source: Ambulatory Visit | Attending: Gastroenterology | Admitting: Gastroenterology

## 2018-08-13 DIAGNOSIS — K219 Gastro-esophageal reflux disease without esophagitis: Secondary | ICD-10-CM | POA: Diagnosis present

## 2018-08-13 DIAGNOSIS — K2970 Gastritis, unspecified, without bleeding: Secondary | ICD-10-CM | POA: Diagnosis not present

## 2018-08-13 DIAGNOSIS — K449 Diaphragmatic hernia without obstruction or gangrene: Secondary | ICD-10-CM | POA: Insufficient documentation

## 2018-08-13 DIAGNOSIS — R1013 Epigastric pain: Secondary | ICD-10-CM | POA: Diagnosis present

## 2018-08-16 ENCOUNTER — Other Ambulatory Visit: Payer: Self-pay

## 2018-08-16 ENCOUNTER — Telehealth: Payer: Self-pay | Admitting: Gastroenterology

## 2018-08-16 DIAGNOSIS — R1013 Epigastric pain: Secondary | ICD-10-CM

## 2018-08-16 MED ORDER — OMEPRAZOLE 20 MG PO CPDR: 20.0000 mg | DELAYED_RELEASE_CAPSULE | Freq: Two times a day (BID) | ORAL | 3 refills | Status: DC

## 2018-08-16 NOTE — Telephone Encounter (Signed)
Pt calling regarding my chart message that she sent this morning. She would like a call back today.

## 2018-08-16 NOTE — Telephone Encounter (Signed)
See patient message

## 2018-08-21 ENCOUNTER — Other Ambulatory Visit (INDEPENDENT_AMBULATORY_CARE_PROVIDER_SITE_OTHER): Payer: 59

## 2018-08-21 DIAGNOSIS — R1013 Epigastric pain: Secondary | ICD-10-CM

## 2018-08-21 LAB — CREATININE, SERUM: Creatinine, Ser: 0.82 mg/dL (ref 0.40–1.20)

## 2018-08-21 LAB — BUN: BUN: 13 mg/dL (ref 6–23)

## 2018-08-22 ENCOUNTER — Ambulatory Visit (INDEPENDENT_AMBULATORY_CARE_PROVIDER_SITE_OTHER)
Admission: RE | Admit: 2018-08-22 | Discharge: 2018-08-22 | Disposition: A | Discharge status: Home / Self Care | Payer: 59 | Source: Ambulatory Visit | Attending: Gastroenterology | Admitting: Gastroenterology

## 2018-08-22 DIAGNOSIS — R1013 Epigastric pain: Secondary | ICD-10-CM | POA: Diagnosis not present

## 2018-08-22 MED ORDER — IOPAMIDOL (ISOVUE-300) INJECTION 61%
100.0000 mL | Freq: Once | INTRAVENOUS | Status: AC | PRN
  Administered 2018-08-22: 100 mL via INTRAVENOUS

## 2018-08-23 ENCOUNTER — Telehealth: Payer: Self-pay | Admitting: Gastroenterology

## 2018-08-23 NOTE — Telephone Encounter (Signed)
Let pt know that we will call her with results as soon as reviewed by MD.

## 2018-08-29 DIAGNOSIS — R918 Other nonspecific abnormal finding of lung field: Secondary | ICD-10-CM | POA: Insufficient documentation

## 2018-10-10 ENCOUNTER — Other Ambulatory Visit: Payer: Self-pay | Admitting: Obstetrics & Gynecology

## 2018-10-10 NOTE — Telephone Encounter (Signed)
Medication refill request: Premarin Last AEX:  04/23/18 SM Next AEX: 08/12/19  Last MMG (if hormonal medication request): 12/17/17 BIRADS 1 negative/density c -- 06/25/18 MR Bilateral - BIRADS 2 benign Refill authorized: 04/23/18 #30g w/2 refills; today please advise

## 2018-12-10 ENCOUNTER — Other Ambulatory Visit: Payer: Self-pay | Admitting: Obstetrics & Gynecology

## 2018-12-10 DIAGNOSIS — Z1231 Encounter for screening mammogram for malignant neoplasm of breast: Secondary | ICD-10-CM

## 2019-01-09 ENCOUNTER — Ambulatory Visit: Payer: 59

## 2019-02-03 ENCOUNTER — Ambulatory Visit: Payer: 59

## 2019-03-18 ENCOUNTER — Ambulatory Visit
Admission: RE | Admit: 2019-03-18 | Discharge: 2019-03-18 | Disposition: A | Discharge status: Home / Self Care | Payer: 59 | Source: Ambulatory Visit | Attending: Obstetrics & Gynecology | Admitting: Obstetrics & Gynecology

## 2019-03-18 ENCOUNTER — Other Ambulatory Visit: Payer: Self-pay

## 2019-03-18 DIAGNOSIS — Z1231 Encounter for screening mammogram for malignant neoplasm of breast: Secondary | ICD-10-CM

## 2019-08-08 ENCOUNTER — Other Ambulatory Visit: Payer: Self-pay

## 2019-08-12 ENCOUNTER — Encounter: Payer: Self-pay | Admitting: Obstetrics & Gynecology

## 2019-08-12 ENCOUNTER — Other Ambulatory Visit: Payer: Self-pay | Admitting: Obstetrics & Gynecology

## 2019-08-12 ENCOUNTER — Other Ambulatory Visit: Payer: Self-pay

## 2019-08-12 ENCOUNTER — Ambulatory Visit: Payer: 59 | Admitting: Obstetrics & Gynecology

## 2019-08-12 VITALS — BP 150/78 | HR 88 | Temp 98.0°F | Ht 60.25 in | Wt 110.6 lb

## 2019-08-12 DIAGNOSIS — N9089 Other specified noninflammatory disorders of vulva and perineum: Secondary | ICD-10-CM

## 2019-08-12 DIAGNOSIS — Z0000 Encounter for general adult medical examination without abnormal findings: Secondary | ICD-10-CM

## 2019-08-12 DIAGNOSIS — Z01419 Encounter for gynecological examination (general) (routine) without abnormal findings: Secondary | ICD-10-CM

## 2019-08-12 MED ORDER — METOPROLOL TARTRATE 25 MG PO TABS: 12.5000 mg | ORAL_TABLET | Freq: Two times a day (BID) | ORAL | 1 refills | Status: DC | PRN

## 2019-08-12 MED ORDER — PREMARIN 0.625 MG/GM VA CREA: TOPICAL_CREAM | VAGINAL | 2 refills | Status: DC

## 2019-08-12 NOTE — Patient Instructions (Signed)

## 2019-08-12 NOTE — Progress Notes (Signed)
58 y.o. G2P2 Married White or Caucasian female here for annual exam.  Doing well.  Has three granddaughters.    Denies vaginal bleeding.    Patient's last menstrual period was 10/16/2006.          Sexually active: Yes.    The current method of family planning is post menopausal status.    Exercising: Yes.    body pumps, walking, and cardio Smoker:  no  Health Maintenance: Pap:  02/05/17 Neg. HR HPV:neg              11/16/15 Neg   09/14/14 Neg History of abnormal Pap:  Yes, atypical endocervical cells 2011, 2012 MMG:  03/18/19 BIRADS 1 negative/density c Colonoscopy:  09/06/12 Normal BMD:   09/22/10 Normal TDaP:  2013 Pneumonia vaccine(s):  n/a Shingrix:   never Hep C testing: 11/16/15 Negative Screening Labs: discuss if needed   reports that she has never smoked. She has never used smokeless tobacco. She reports that she does not drink alcohol or use drugs.  Past Medical History:  Diagnosis Date  . H/O pheochromocytoma   . H/O [facility] spotted fever   . MVP (mitral valve prolapse)   . Rash    all over body in 03/13/? etiology  . TMJ (temporomandibular joint syndrome)     Past Surgical History:  Procedure Laterality Date  . CHOLECYSTECTOMY     and exploratory surgery in pelvic area  . DILATION AND CURETTAGE OF UTERUS  6/04  . pheochromocytoma     removal in  2000  . WISDOM TOOTH EXTRACTION      Current Outpatient Medications  Medication Sig Dispense Refill  . Acetaminophen (TYLENOL 8 HOUR PO) Take 2 capsules by mouth as needed (AS NEEDED FOR HEADACHE).     Marland Kitchen conjugated estrogens (PREMARIN) vaginal cream APPLY 0.5 GRAM VAGINALLY 2 TIMES A WEEK 30 g 2  . Cyanocobalamin (VITAMIN B 12) 100 MCG LOZG Take 1 tablet by mouth daily.     . Ibuprofen (ADVIL) 200 MG CAPS Take 2 capsules by mouth as needed (AS NEEDED FOR MODERATE PAIN).     Marland Kitchen lidocaine (XYLOCAINE) 5 % ointment Apply 1 application topically daily as needed for mild pain or moderate pain. Apply topically as  directed 30 g 0  . metoprolol tartrate (LOPRESSOR) 25 MG tablet Take 0.5 tablets (12.5 mg total) by mouth 2 (two) times daily. (Patient taking differently: Take 12.5 mg by mouth 2 (two) times daily as needed. ) 30 tablet 3  . Multiple Vitamin (MULTIVITAMIN) tablet Take 1 tablet by mouth daily.     No current facility-administered medications for this visit.     Family History  Problem Relation Age of Onset  . Breast cancer Mother 21       deceased age 46 from blood clot in lung secondary complications in breast cancer treatment  . Heart disease Paternal Grandfather   . Osteoporosis Maternal Grandmother   . Rheum arthritis Maternal Grandmother   . Bladder Cancer Maternal Grandfather     Review of Systems  Constitutional: Negative.   HENT: Negative.   Eyes: Negative.   Respiratory: Negative.   Cardiovascular: Negative.   Gastrointestinal: Negative.   Endocrine: Negative.   Genitourinary: Negative.   Musculoskeletal: Negative.   Skin: Negative.   Allergic/Immunologic: Negative.   Neurological: Negative.   Hematological: Negative.   Psychiatric/Behavioral: Negative.     Exam:   BP (!) 150/78 (BP Location: Right Arm, Patient Position: Sitting, Cuff Size: Normal)   Pulse  88   Temp 98 F (36.7 C) (Temporal)   Ht 5' 0.25" (1.53 m)   Wt 110 lb 9.6 oz (50.2 kg)   LMP 10/16/2006   BMI 21.42 kg/m   Height: 5' 0.25" (153 cm)  Ht Readings from Last 3 Encounters:  08/12/19 5' 0.25" (1.53 m)  07/30/18 5\' 1"  (1.549 m)  07/29/18 5\' 1"  (1.549 m)    General appearance: alert, cooperative and appears stated age Head: Normocephalic, without obvious abnormality, atraumatic Neck: no adenopathy, supple, symmetrical, trachea midline and thyroid normal to inspection and palpation Lungs: clear to auscultation bilaterally Breasts: normal appearance, no masses or tenderness Heart: regular rate and rhythm Abdomen: soft, non-tender; bowel sounds normal; no masses,  no  organomegaly Extremities: extremities normal, atraumatic, no cyanosis or edema Skin: Skin color, texture, turgor normal. No rashes or lesions Lymph nodes: Cervical, supraclavicular, and axillary nodes normal. No abnormal inguinal nodes palpated Neurologic: Grossly normal   Pelvic: External genitalia:  Thickened appearance of skin with ulcerations              Urethra:  normal appearing urethra with no masses, tenderness or lesions              Bartholins and Skenes: normal                 Vagina: normal appearing vagina with normal color and discharge, no lesions              Cervix: no lesions              Pap taken: No. Bimanual Exam:  Uterus:  normal size, contour, position, consistency, mobility, non-tender              Adnexa: normal adnexa and no mass, fullness, tenderness               Rectovaginal: Confirms               Anus:  normal sphincter tone, no lesions  Biopsy recommended of skin change.  Verbal consent obtained.  Procedure:  Area cleansed with Betadine.  Sterile technique used throughout procedure.  Skin anesthestized with Lidocaine 1% plain; 1.66mL.  4 punch biopsy used to obtain specimen.  Biopsy grasped with pick-ups and excised with scissors.  Adequate hemostasis obtained with silver nitrate sticks.  Dressing was not applied.  Pt tolerated procedure well.  Chaperone was present for exam.  A:  Well Woman with normal exam PMP, no HRT Family hx of breast cancer, mother diagnosed 21.  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  Pt is going to let me know when she wants to schedule pap smear with neg HR HPV 2018.  Not indicated today CBC, CMP, Lipids, TSH and Vit D obtained today RF for premarin 1/2 pv twice weekly  #30gm/3RF Colonoscopy is UTD Shingrix vaccination discussed.   Return annually or prn

## 2019-08-13 LAB — CBC
Hematocrit: 44.6 % (ref 34.0–46.6)
Hemoglobin: 15 g/dL (ref 11.1–15.9)
MCH: 31.4 pg (ref 26.6–33.0)
MCHC: 33.6 g/dL (ref 31.5–35.7)
MCV: 94 fL (ref 79–97)
Platelets: 358 10*3/uL (ref 150–450)
RBC: 4.77 x10E6/uL (ref 3.77–5.28)
RDW: 11.8 % (ref 11.7–15.4)
WBC: 8.7 10*3/uL (ref 3.4–10.8)

## 2019-08-13 LAB — LIPID PANEL
Chol/HDL Ratio: 2.3 ratio (ref 0.0–4.4)
Cholesterol, Total: 197 mg/dL (ref 100–199)
HDL: 87 mg/dL (ref >39)
LDL Chol Calc (NIH): 98 mg/dL (ref 0–99)
Triglycerides: 66 mg/dL (ref 0–149)
VLDL Cholesterol Cal: 12 mg/dL (ref 5–40)

## 2019-08-13 LAB — COMPREHENSIVE METABOLIC PANEL
ALT: 15 IU/L (ref 0–32)
AST: 26 IU/L (ref 0–40)
Albumin/Globulin Ratio: 1.7 (ref 1.2–2.2)
Albumin: 4.7 g/dL (ref 3.8–4.9)
Alkaline Phosphatase: 89 IU/L (ref 39–117)
BUN/Creatinine Ratio: 19 (ref 9–23)
BUN: 13 mg/dL (ref 6–24)
Bilirubin Total: 0.6 mg/dL (ref 0.0–1.2)
CO2: 27 mmol/L (ref 20–29)
Calcium: 9.6 mg/dL (ref 8.7–10.2)
Chloride: 98 mmol/L (ref 96–106)
Creatinine, Ser: 0.7 mg/dL (ref 0.57–1.00)
GFR calc Af Amer: 110 mL/min/{1.73_m2} (ref >59)
GFR calc non Af Amer: 96 mL/min/{1.73_m2} (ref >59)
Globulin, Total: 2.8 g/dL (ref 1.5–4.5)
Glucose: 80 mg/dL (ref 65–99)
Potassium: 3.8 mmol/L (ref 3.5–5.2)
Sodium: 138 mmol/L (ref 134–144)
Total Protein: 7.5 g/dL (ref 6.0–8.5)

## 2019-08-13 LAB — VITAMIN D 25 HYDROXY (VIT D DEFICIENCY, FRACTURES): Vit D, 25-Hydroxy: 36.5 ng/mL (ref 30.0–100.0)

## 2019-08-13 LAB — TSH: TSH: 2.44 u[IU]/mL (ref 0.450–4.500)

## 2019-08-15 ENCOUNTER — Telehealth: Payer: Self-pay | Admitting: *Deleted

## 2019-08-15 NOTE — Telephone Encounter (Signed)
Patient calling for biopsy results

## 2019-08-15 NOTE — Telephone Encounter (Signed)
Spoke with patient. Advised 08/12/19 vulvar biopsy still pending. Advised can take up to 7 days for results to be completed. Once results are back and reviewed by provider our office will notify of results. Patient states she is anxious for results, she will return call on Monday to check.   Routing to provider for final review. Patient is agreeable to disposition. Will close encounter.

## 2019-08-20 ENCOUNTER — Other Ambulatory Visit: Payer: Self-pay | Admitting: Obstetrics & Gynecology

## 2019-08-20 MED ORDER — CLOBETASOL PROPIONATE 0.05 % EX OINT: TOPICAL_OINTMENT | CUTANEOUS | 0 refills | Status: DC

## 2019-08-20 MED ORDER — METOPROLOL TARTRATE 25 MG PO TABS: 12.5000 mg | ORAL_TABLET | Freq: Two times a day (BID) | ORAL | 2 refills | Status: DC | PRN

## 2019-09-16 ENCOUNTER — Telehealth: Payer: Self-pay | Admitting: Obstetrics & Gynecology

## 2019-09-16 DIAGNOSIS — Z9189 Other specified personal risk factors, not elsewhere classified: Secondary | ICD-10-CM

## 2019-09-16 DIAGNOSIS — Z1239 Encounter for other screening for malignant neoplasm of breast: Secondary | ICD-10-CM

## 2019-09-16 DIAGNOSIS — R922 Inconclusive mammogram: Secondary | ICD-10-CM

## 2019-09-16 DIAGNOSIS — Z1231 Encounter for screening mammogram for malignant neoplasm of breast: Secondary | ICD-10-CM

## 2019-09-16 DIAGNOSIS — Z803 Family history of malignant neoplasm of breast: Secondary | ICD-10-CM

## 2019-09-16 NOTE — Telephone Encounter (Signed)
Patient says she was to follow up with Dr Sabra Heck after taking new medication.

## 2019-09-16 NOTE — Telephone Encounter (Signed)
1. Spoke with patient, patient calling to schedule 4-6 wk f/u. Clobetasol is providing some improvement. OV scheduled for 09/22/19 at 1:30pm with Dr. Sabra Heck.    Notes recorded by Megan Salon, MD on 08/20/2019 at 1:29 PM EST  Called pt personally. Pathology reviewed with pt. Treatment with clobetasol 0.05% ointment nightly recommended. #60gram/0RF rx sent to pharmacy. Pt needs recheck in 4-6 weeks. Please call to schedule appt in 4-6 weeks with her. The rx has been done. Thanks.   2. Patient request to proceed with yearly screening breast MRI as previously discussed with Dr. Sabra Heck at 08/12/19 AEX. Last screening MMG 03/18/19, last breast MRI 06/25/18.   Family hx breast cancer mother at age 50. Increased lifetime risk 23.6%. Dense breast tissue on MMG.   Order placed for bilateral breast MRI w/wo contrast at [facility]. Advised patient they will contact her directly to schedule. Our office will precert once scheduled. Patient verbalizes understanding and is agreeable.   Routing to provider for final review. Patient is agreeable to disposition. Will close encounter.  Cc: Lerry Liner

## 2019-09-19 ENCOUNTER — Other Ambulatory Visit: Payer: Self-pay

## 2019-09-22 ENCOUNTER — Encounter: Payer: Self-pay | Admitting: Obstetrics & Gynecology

## 2019-09-22 ENCOUNTER — Other Ambulatory Visit: Payer: Self-pay

## 2019-09-22 ENCOUNTER — Ambulatory Visit
Admission: RE | Admit: 2019-09-22 | Discharge: 2019-09-22 | Disposition: A | Discharge status: Home / Self Care | Payer: 59 | Source: Ambulatory Visit | Attending: Obstetrics & Gynecology | Admitting: Obstetrics & Gynecology

## 2019-09-22 ENCOUNTER — Ambulatory Visit: Payer: 59 | Admitting: Obstetrics & Gynecology

## 2019-09-22 VITALS — BP 128/70 | HR 84 | Temp 96.4°F | Resp 12 | Wt 110.2 lb

## 2019-09-22 DIAGNOSIS — Z1239 Encounter for other screening for malignant neoplasm of breast: Secondary | ICD-10-CM

## 2019-09-22 DIAGNOSIS — Z803 Family history of malignant neoplasm of breast: Secondary | ICD-10-CM

## 2019-09-22 DIAGNOSIS — L900 Lichen sclerosus et atrophicus: Secondary | ICD-10-CM

## 2019-09-22 DIAGNOSIS — Z9189 Other specified personal risk factors, not elsewhere classified: Secondary | ICD-10-CM

## 2019-09-22 DIAGNOSIS — R922 Inconclusive mammogram: Secondary | ICD-10-CM

## 2019-09-22 MED ORDER — GADOBUTROL 1 MMOL/ML IV SOLN
5.0000 mL | Freq: Once | INTRAVENOUS | Status: AC | PRN
  Administered 2019-09-22: 5 mL via INTRAVENOUS

## 2019-09-22 NOTE — Progress Notes (Signed)
GYNECOLOGY  VISIT  CC:   Per patient, lichen sclerosis has improved.  HPI: 58 y.o. G2P2 Married White or Caucasian female here for follow-up lichen sclerosis.  Reports skin feels much better.  She was able to have intercourse recently with much less pain.  She is using the steroid at night only.  No discharge is present.  Results and typical course of symptoms discussed.  Pt aware that new products can sometimes cause new symptoms.  She knows to use the steroid twice daily for up to 5 days with flare but needs to call if has any new issues that persist or is symptoms seem different.   Pt does have breast MRI scheduled today.  Aware I will review results as soon as possible.    GYNECOLOGIC HISTORY: Patient's last menstrual period was 10/16/2006. Contraception: Postmenopausal Menopausal hormone therapy: Premarin  Patient Active Problem List   Diagnosis Date Noted  . Pulmonary nodules/lesions, multiple 08/29/2018  . Closed nondisplaced fracture of shaft of fifth metacarpal bone of left hand with routine healing 01/14/2018  . History of pheochromocytoma 12/16/2015  . Sinus tachycardia 12/16/2015  . Skipped heart beats 11/16/2015  . 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 H/O pheochromocytoma   . H/O [facility] spotted fever   . MVP (mitral valve prolapse)   . Rash    all over body in 03/13/? etiology  . TMJ (temporomandibular joint syndrome)     Past Surgical History:  Procedure Laterality Date  . CHOLECYSTECTOMY     and exploratory surgery in pelvic area  . DILATION AND CURETTAGE OF UTERUS  6/04  . pheochromocytoma     removal in  2000  . WISDOM TOOTH EXTRACTION      MEDS:   Current Outpatient Medications on File Prior to Visit  Medication Sig Dispense Refill  . Acetaminophen (TYLENOL 8 HOUR PO) Take 2 capsules by mouth as needed (AS NEEDED FOR HEADACHE).     . clobetasol ointment (TEMOVATE) 0.05 % Apply this amount nightly until  follow up visit. 60 g 0  . conjugated estrogens (PREMARIN) vaginal cream APPLY 0.5 GRAM VAGINALLY 2 TIMES A WEEK 30 g 2  . Cyanocobalamin (VITAMIN B 12) 100 MCG LOZG Take 1 tablet by mouth daily.     . Ibuprofen (ADVIL) 200 MG CAPS Take 2 capsules by mouth as needed (AS NEEDED FOR MODERATE PAIN).     Marland Kitchen lidocaine (XYLOCAINE) 5 % ointment Apply 1 application topically daily as needed for mild pain or moderate pain. Apply topically as directed 30 g 0  . metoprolol tartrate (LOPRESSOR) 25 MG tablet Take 0.5 tablets (12.5 mg total) by mouth 2 (two) times daily as needed. 30 tablet 2  . Multiple Vitamin (MULTIVITAMIN) tablet Take 1 tablet by mouth daily.     No current facility-administered medications on file prior to visit.     ALLERGIES: Sulfa antibiotics and Epinephrine  Family History  Problem Relation Age of Onset  . Breast cancer Mother 77       deceased age 74 from blood clot in lung secondary complications in breast cancer treatment  . Heart disease Paternal Grandfather   . Osteoporosis Maternal Grandmother   . Rheum arthritis Maternal Grandmother   . Bladder Cancer Maternal Grandfather     SH:  Married, non smoker  Review of Systems  All other systems reviewed and are negative.   PHYSICAL EXAMINATION:    BP 128/70 (BP Location: Right Arm,  Patient Position: Sitting, Cuff Size: Normal)   Pulse 84   Temp (!) 96.4 F (35.8 C) (Temporal)   Resp 12   Wt 110 lb 3.2 oz (50 kg)   LMP 10/16/2006   BMI 21.34 kg/m     General appearance: alert, cooperative and appears stated age  Lymph:  no inguinal LAD noted  Pelvic: External genitalia:  no lesions, normal appearing ticcues.              Urethra:  normal appearing urethra with no masses, tenderness or lesions              Bartholins and Skenes: normal                 Vagina: atrophic changes noted               Chaperone was present for exam.  Assessment: Vulvar itching with biopsy showing probable lichen sclerosus, much  improved  Plan: She will taper off the topical steroid over the next month.  Instructions given. She knows what to do with a "flare" and to call with any new symptoms/concerns. Breast MRI is schedule   ~15 minutes spent with patient >50% of time was in face to face discussion of above.

## 2019-09-22 NOTE — Patient Instructions (Signed)
Please decrease the steroid to twice weekly for the next month and then stop.  If you have a "flare" of symptoms, it is ok to restart the topical steroid twice daily for 5 days.

## 2019-09-23 ENCOUNTER — Telehealth: Payer: Self-pay | Admitting: *Deleted

## 2019-09-23 DIAGNOSIS — Z9189 Other specified personal risk factors, not elsewhere classified: Secondary | ICD-10-CM

## 2019-09-23 DIAGNOSIS — N6310 Unspecified lump in the right breast, unspecified quadrant: Secondary | ICD-10-CM

## 2019-09-23 DIAGNOSIS — Z803 Family history of malignant neoplasm of breast: Secondary | ICD-10-CM

## 2019-09-23 NOTE — Telephone Encounter (Signed)
-----   Message from Volanda Napoleon sent at 09/23/2019 11:38 AM EST ----- Regarding: Breast MRI Recommendations Hi Sharee Pimple, The above patient had a breast MRI 09/22/19 and below are the recommendations of Dr. Kristopher Oppenheim.  RECOMMENDATION: MRI guided biopsy of the right breast.   Please review the recommendations with the patient and enter any necessary orders.  Thank you.  [place]

## 2019-09-23 NOTE — Telephone Encounter (Signed)
Per review of Epic, screening breast MRI completed today.  Indeterminate 6 mm enhancing mass in the lateral right breast. Recommendation is for MRI guided biopsy.  Orders pended for:  MR RT BREAST BX 1ST LESION - [id number]  MM CLIP PLACEMENT RIGHT - [id number]   Routing to Dr. Sabra Heck to review and advise.

## 2019-09-25 NOTE — Telephone Encounter (Signed)
Routing to Dr. Miller

## 2019-09-25 NOTE — Telephone Encounter (Signed)
Spoke with pt personally.  She is ready to proceed with MRI guided biopsy.  MRI result routed to you.

## 2019-09-25 NOTE — Telephone Encounter (Signed)
Patient is asking if her breast MRI results have been reviewed by Dr.Miller. She is very anxious to get her results today.

## 2019-09-26 NOTE — Telephone Encounter (Signed)
MRI guided breast biopsy for right breast placed.   TBC notified. They will contact the patient directly for scheduling.   Continued IMG hold.   Encounter closed.

## 2019-09-30 ENCOUNTER — Other Ambulatory Visit: Payer: Self-pay

## 2019-09-30 ENCOUNTER — Ambulatory Visit
Admission: RE | Admit: 2019-09-30 | Discharge: 2019-09-30 | Disposition: A | Discharge status: Home / Self Care | Payer: 59 | Source: Ambulatory Visit | Attending: Obstetrics & Gynecology | Admitting: Obstetrics & Gynecology

## 2019-09-30 ENCOUNTER — Other Ambulatory Visit (HOSPITAL_COMMUNITY): Payer: Self-pay | Admitting: Diagnostic Radiology

## 2019-09-30 DIAGNOSIS — Z803 Family history of malignant neoplasm of breast: Secondary | ICD-10-CM

## 2019-09-30 DIAGNOSIS — N6310 Unspecified lump in the right breast, unspecified quadrant: Secondary | ICD-10-CM

## 2019-09-30 DIAGNOSIS — Z9189 Other specified personal risk factors, not elsewhere classified: Secondary | ICD-10-CM

## 2019-09-30 MED ORDER — GADOBUTROL 1 MMOL/ML IV SOLN
5.0000 mL | Freq: Once | INTRAVENOUS | Status: AC | PRN
  Administered 2019-09-30: 5 mL via INTRAVENOUS

## 2019-10-14 ENCOUNTER — Other Ambulatory Visit: Payer: 59

## 2019-10-16 ENCOUNTER — Other Ambulatory Visit: Payer: 59

## 2019-11-14 ENCOUNTER — Telehealth: Payer: Self-pay | Admitting: Obstetrics & Gynecology

## 2019-11-14 DIAGNOSIS — N6310 Unspecified lump in the right breast, unspecified quadrant: Secondary | ICD-10-CM

## 2019-11-14 DIAGNOSIS — Z9189 Other specified personal risk factors, not elsewhere classified: Secondary | ICD-10-CM

## 2019-11-14 DIAGNOSIS — Z803 Family history of malignant neoplasm of breast: Secondary | ICD-10-CM

## 2019-11-14 NOTE — Telephone Encounter (Signed)
Patient is ready to schedule her follow up breast MRI and was told to call and have an order sent to the breast center of [facility].

## 2019-11-17 NOTE — Telephone Encounter (Signed)
Spoke to pt. Pt wanting breast MRI orders to schedule 3 month follow up from 10/01/19. Pt going to call after orders placed for appt at [facility]. Pt verbalized understanding.   Routing to Dr Sabra Heck for review and will close encounter. Future orders placed for breast MRI.

## 2019-12-25 ENCOUNTER — Telehealth: Payer: Self-pay | Admitting: *Deleted

## 2019-12-25 ENCOUNTER — Ambulatory Visit
Admission: RE | Admit: 2019-12-25 | Discharge: 2019-12-25 | Disposition: A | Discharge status: Home / Self Care | Payer: 59 | Source: Ambulatory Visit | Attending: Obstetrics & Gynecology | Admitting: Obstetrics & Gynecology

## 2019-12-25 DIAGNOSIS — N6310 Unspecified lump in the right breast, unspecified quadrant: Secondary | ICD-10-CM

## 2019-12-25 DIAGNOSIS — Z803 Family history of malignant neoplasm of breast: Secondary | ICD-10-CM

## 2019-12-25 DIAGNOSIS — Z9189 Other specified personal risk factors, not elsewhere classified: Secondary | ICD-10-CM

## 2019-12-25 MED ORDER — GADOBUTROL 1 MMOL/ML IV SOLN
5.0000 mL | Freq: Once | INTRAVENOUS | Status: AC | PRN
  Administered 2019-12-25: 5 mL via INTRAVENOUS

## 2019-12-25 NOTE — Telephone Encounter (Signed)
Spoke with [place] at [facility]. Patient was seen today for 3 mo f/u breast MRI for right breast mass. See results below, MRI guided right breast biopsy recommended, Dr. Owens Shark request to schedule with her. First available appt 3/17. Please notify patient and place order.     IMPRESSION: 1. Unchanged appearance of 6 millimeter mass in the LATERAL mid aspect of the RIGHT breast. Although biopsy changes are seen immediately anterior to the lesion, there is no clear evidence that the lesion was adequately sampled. 2. Known clip migration, with clip in the MEDIAL aspect of the RIGHT breast. 3. No new findings in either breast.  RECOMMENDATION: Recommend MR guided core biopsy of RIGHT breast mass.  The findings are discussed with the patient by telephone.  BI-RADS CATEGORY  4: Suspicious.    Orders pended.   Dr. Sabra Heck -ok to advise patient and proceed with scheduling?

## 2019-12-26 ENCOUNTER — Telehealth: Payer: Self-pay | Admitting: Obstetrics & Gynecology

## 2019-12-26 NOTE — Telephone Encounter (Signed)
Yes, ok to proceed with scheduling.  Thank you.

## 2019-12-26 NOTE — Telephone Encounter (Signed)
Spoke with patient, advised of results as seen below per Dr. Sabra Heck. Patient is agreeable to proceed with MR guided right breast Bx on 3/17 to be done by Dr. Owens Shark.   Orders placed. Patient is aware she will be contacted by [facility] to schedule. Patient verbalizes understanding and is agreeable.   Placed in [place] hold.   Routing to provider for final review. Patient is agreeable to disposition. Will close encounter.

## 2019-12-26 NOTE — Telephone Encounter (Signed)
Call placed to [facility] to start PA for scheduled appointment 12/31/19 for MRI RT BREAST BX. Per rep [place] code [phone] does not require prior authorization.

## 2019-12-31 ENCOUNTER — Ambulatory Visit
Admission: RE | Admit: 2019-12-31 | Discharge: 2019-12-31 | Disposition: A | Discharge status: Home / Self Care | Payer: 59 | Source: Ambulatory Visit | Attending: Obstetrics & Gynecology | Admitting: Obstetrics & Gynecology

## 2019-12-31 ENCOUNTER — Other Ambulatory Visit: Payer: Self-pay

## 2019-12-31 ENCOUNTER — Other Ambulatory Visit (HOSPITAL_COMMUNITY): Payer: Self-pay | Admitting: Diagnostic Radiology

## 2019-12-31 DIAGNOSIS — N6310 Unspecified lump in the right breast, unspecified quadrant: Secondary | ICD-10-CM

## 2019-12-31 MED ORDER — GADOBUTROL 1 MMOL/ML IV SOLN
5.0000 mL | Freq: Once | INTRAVENOUS | Status: AC | PRN
  Administered 2019-12-31: 5 mL via INTRAVENOUS

## 2020-01-02 ENCOUNTER — Telehealth: Payer: Self-pay | Admitting: *Deleted

## 2020-01-02 NOTE — Telephone Encounter (Signed)
Burnice Logan, RN  01/02/2020 10:06 AM EDT    Spoke with patient. Patient has been notified of [facility] by [facility]. Patient is working with [facility] to coordinate scheduling with general surgeon. Patient verbalizes understanding and thankful for follow up. Patient has additional questions, see telephone encounter dated 01/02/20 to review with provider.   Removed from IM[facility] hold.

## 2020-01-02 NOTE — Telephone Encounter (Signed)
1. Patient is using clobetasol ointment for Lichen sclerosus, uses it on an average 3-5 days per month, medication is effective. Patient asking if any alternative medication recommended or ok to use this often?   2. Patient states Premarin vaginal cream no longer covered by insurance. She is not out of medication. Request alternative options.   Reviewed option of estrace vaginal cream, patient is going to check with her plan to see if this is a covered alternative and let me know, advised can then send in RX.   Reviewed option of Premarin manufacture savings card. Patient is also going to check on the cost of this with savings card and let me know how she would like to proceed.    Dr. Sabra Heck -please advise on clobetasol for LS. And estrace RX, if needed.

## 2020-01-02 NOTE — Telephone Encounter (Signed)
-----   Message from Megan Salon, MD sent at 01/02/2020  5:42 AM EDT ----- Pt's pathology showed a sclerosing lesion.  She does need general surgery consultation and lumpectomy.  Usually the breast center sets this up.  Can you please check with pt and make sure she's been scheduled for an appt?   Also, can you see if she has any questions.  Ok to remove from imaging hold.

## 2020-01-09 ENCOUNTER — Other Ambulatory Visit (HOSPITAL_COMMUNITY): Payer: Self-pay | Admitting: Surgery

## 2020-01-09 ENCOUNTER — Other Ambulatory Visit: Payer: Self-pay | Admitting: Obstetrics & Gynecology

## 2020-01-09 DIAGNOSIS — N6310 Unspecified lump in the right breast, unspecified quadrant: Secondary | ICD-10-CM

## 2020-01-09 MED ORDER — HYDROCORTISONE 0.5 % EX OINT: TOPICAL_OINTMENT | CUTANEOUS | 1 refills | Status: DC

## 2020-01-09 NOTE — Telephone Encounter (Signed)
Spoke with patient. Recommendations from [place] provided to patient as seen below. Patient verbalizes understanding. Encounter closed.

## 2020-01-09 NOTE — Telephone Encounter (Signed)
1) we probably should use a lower potency steroid is she is using is a few days each month.  I will send a prescription for a lower potency steroid ointment to use more frequently.  2) Biopsy of breast showed sclerosing lesion.  She will likely have lumpectomy for full removal.  Estrogen is typically not recommended after this so I would really like to wait to make any estrogen recommendations until she's done with additional evaluation.  Vaginal estrogen cream use is something she will want to discuss with the breast surgeon.

## 2020-01-09 NOTE — Telephone Encounter (Signed)
Spoke with patient. Advised of message as seen below from [place]. Patient has additional questions.  1. Patient has a hematoma from breat biopsy. Breast surgeon is recommending waiting 1 month to perform lumpectomy. Breast surgeon states 5% chance of malignancy. Can patient continue vaginal estrogen until lumpectomy is performed?  2. Asking if Cherith Liley touch procedure would be a recommended alternative? Patient has lichen sclerosus and concerned about having this done and causing further problems.  3. Reports she has "Extra tissue" from her episiotomies and would like to have this surgically removed if possible. Asking if this can be combined at the same time of lumpectomy?

## 2020-01-09 NOTE — Telephone Encounter (Signed)
Call to patient. Left message to call back to triage nurse.

## 2020-01-09 NOTE — Telephone Encounter (Signed)
1)  I think she should stop the estrogen cream now and just wait until the lumpectomy is completed and we have final pathology.  2)  Sheila Mcbride touch is internal and could be done.  Lichen sclerosus is external.  3)  No.  These are not surgeries that should be combines.

## 2020-01-12 ENCOUNTER — Telehealth: Payer: Self-pay

## 2020-01-12 ENCOUNTER — Telehealth: Payer: Self-pay | Admitting: Cardiovascular Disease

## 2020-01-12 NOTE — Telephone Encounter (Signed)
Opened chart to see if Guinevere Ferrari has received records from [facility] to schedule patient for pre op clearance.

## 2020-01-12 NOTE — Telephone Encounter (Signed)
A rx was sent to pharmacy for hydrocortisone 0.5% on 01-09-2020. Pharmacy sent a fax back stating they only have cream in that percentage. Per Dr Sabra Heck, I called pharmacy to see what lower potency ointment do they have for the vulva area. Per pharmacy they have hydrocortisone 2.5% ointment, triamcinolone 0.025% ointment & fluticasone 0.005% ointment. Per pharmacist they state they are all for topical use so should be fine on the vulva area since it is external use. Routing to Dr Sabra Heck.

## 2020-01-12 NOTE — Telephone Encounter (Signed)
Please have the patient use the over the counter hydrocortisone ointment as needed until Dr Sabra Heck returns. I'm not sure what her preference is.

## 2020-01-13 NOTE — Telephone Encounter (Signed)
Patient notified & aware we will call her back next week.

## 2020-01-13 NOTE — Progress Notes (Signed)
Sheila Fee, MD Reason for referral-preoperative evaluation prior to lumpectomy  HPI: 59 year old female for preoperative evaluation prior to lumpectomy.  Patient seen by Dr. Acie Fredrickson in the past but not since 2017.  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  Urine for catecholamines and metanephrines March 2017 normal.  Echocardiogram March 2017 showed normal LV function, grade 1 diastolic dysfunction.  Nuclear study April 2017 showed ejection fraction 51%; inferior thinning consistent with soft tissue attenuation versus small scar but no ischemia. Abdominal CT November 2019 showed prior right adrenalectomy.  Left adrenal gland unremarkable.  Bilateral lower lobe pulmonary nodules measuring up to 6 mm in noncontrast chest CT recommended to follow-up.  Follow-up chest CT [facility] November 2019 showed 4 and 5 mm right lower lobe lung nodules.  TSH October 2020 normal; Hgb 15.  She is very functional.  She can walk 5 to 6 miles at a time with no dyspnea or chest pain.  There is no orthopnea, PND, pedal edema or syncope.  Occasional palpitations improved with metoprolol.  Current Outpatient Medications  Medication Sig Dispense Refill  . Acetaminophen (TYLENOL 8 HOUR PO) Take 2 capsules by mouth as needed (AS NEEDED FOR HEADACHE).     . clobetasol ointment (TEMOVATE) 0.05 % Apply this amount nightly until follow up visit. 60 g 0  . Cyanocobalamin (VITAMIN B 12) 100 MCG LOZG Take 1 tablet by mouth daily.     . hydrocortisone ointment 0.5 % Apply bid as needed for itching.  Do not use for more than 5 days. 30 g 1  . Ibuprofen (ADVIL) 200 MG CAPS Take 2 capsules by mouth as needed (AS NEEDED FOR MODERATE PAIN).     . Multiple Vitamin (MULTIVITAMIN) tablet Take 1 tablet by mouth daily.    Marland Kitchen conjugated estrogens (PREMARIN) vaginal cream APPLY 0.5 GRAM VAGINALLY 2 TIMES A WEEK (Patient not taking: Reported on 01/15/2020) 30 g 2  . lidocaine  (XYLOCAINE) 5 % ointment Apply 1 application topically daily as needed for mild pain or moderate pain. Apply topically as directed (Patient not taking: Reported on 01/15/2020) 30 g 0  . metoprolol tartrate (LOPRESSOR) 25 MG tablet Take 0.5 tablets (12.5 mg total) by mouth 2 (two) times daily as needed. (Patient not taking: Reported on 01/15/2020) 30 tablet 2   No current facility-administered medications for this visit.    Allergies  Allergen Reactions  . Sulfa Antibiotics Hives  . Epinephrine Palpitations     Past Medical History:  Diagnosis Date  . H/O pheochromocytoma   . H/O [facility] spotted fever   . MVP (mitral valve prolapse)   . Rash    all over body in 03/13/? etiology  . TMJ (temporomandibular joint syndrome)     Past Surgical History:  Procedure Laterality Date  . CHOLECYSTECTOMY     and exploratory surgery in pelvic area  . DILATION AND CURETTAGE OF UTERUS  6/04  . pheochromocytoma     removal in  2000  . WISDOM TOOTH EXTRACTION      Social History   Socioeconomic History  . Marital status: Married    Spouse name: Not on file  . Number of children: 2  . Years of education: Not on file  . Highest education level: Not on file  Occupational History  . Not on file  Tobacco Use  . Smoking status: Never Smoker  . Smokeless tobacco: Never Used  Substance and Sexual Activity  . Alcohol  use: No  . Drug use: No  . Sexual activity: Yes    Partners: Male    Birth control/protection: Other-see comments, Post-menopausal    Comment: vasectomy  Other Topics Concern  . Not on file  Social History Narrative  . Not on file   Social Determinants of Health   Financial Resource Strain:   . Difficulty of Paying Living Expenses:   Food Insecurity:   . Worried About Charity fundraiser in the Last Year:   . Arboriculturist in the Last Year:   Transportation Needs:   . Film/video editor (Medical):   Marland Kitchen Lack of Transportation (Non-Medical):   Physical  Activity:   . Days of Exercise per Week:   . Minutes of Exercise per Session:   Stress:   . Feeling of Stress :   Social Connections:   . Frequency of Communication with Friends and Family:   . Frequency of Social Gatherings with Friends and Family:   . Attends Religious Services:   . Active Member of Clubs or Organizations:   . Attends Archivist Meetings:   Marland Kitchen Marital Status:   Intimate Partner Violence:   . Fear of Current or Ex-Partner:   . Emotionally Abused:   Marland Kitchen Physically Abused:   . Sexually Abused:     Family History  Problem Relation Age of Onset  . Breast cancer Mother 33       deceased age 82 from blood clot in lung secondary complications in breast cancer treatment  . Heart disease Paternal Grandfather   . Osteoporosis Maternal Grandmother   . Rheum arthritis Maternal Grandmother   . Bladder Cancer Maternal Grandfather     ROS: no fevers or chills, productive cough, hemoptysis, dysphasia, odynophagia, melena, hematochezia, dysuria, hematuria, rash, seizure activity, orthopnea, PND, pedal edema, claudication. Remaining systems are negative.  Physical Exam:   Blood pressure (!) 144/86, pulse (!) 129, temperature (!) 96.8 F (36 C), height 5\' 1"  (1.549 m), weight 112 lb (50.8 kg), last menstrual period 10/16/2006, SpO2 100 %.  General:  Well developed/well nourished in NAD Skin warm/dry Patient not depressed No peripheral clubbing Back-normal HEENT-normal/normal eyelids Neck supple/normal carotid upstroke bilaterally; no bruits; no JVD; no thyromegaly chest - CTA/ normal expansion CV - RRR/normal S1 and S2; no murmurs, rubs or gallops;  PMI nondisplaced Abdomen -NT/ND, no HSM, no mass, + bowel sounds, no bruit 2+ femoral pulses, no bruits Ext-no edema, chords, 2+ DP Neuro-grossly nonfocal  ECG -sinus tachycardia at a rate of 129, diffuse ST depression.  Personally reviewed  A/P  1 preoperative evaluation prior to lumpectomy-she has excellent  functional capacity and is able to ambulate 5 to 6 miles with no dyspnea or chest pain.  Previous nuclear study showed no ischemia and echocardiogram showed normal LV function.  She will not require further cardiac evaluation preoperatively.  She does have some degree of tachycardia with anxiety.  I have asked her to take metoprolol 12.5 mg on the morning of her surgery (she takes this as needed at home for palpitations).  2 sinus tachycardia-recent TSH and hemoglobin normal.  Urine for metanephrines and catecholamines normal in 2017 and follow-up abdominal CT 2019 showed previous right adrenalectomy and normal left adrenal. There is typically a 16% recurrence rate in patients with inherited disorders associated with pheochromocytoma; however Ms. Ruggerio's appear to be spontaneous and therefore recurrence rate lower.  Patient only feels her heart rate elevate when she is anxious.  She has no  other tachycardia.  3 history of mitral valve prolapse-not evident on most recent echocardiogram and no mitral regurgitation.  4 history of lung nodule-we will arrange noncontrast chest CT.  Sheila Ruths, MD

## 2020-01-14 ENCOUNTER — Telehealth: Payer: Self-pay | Admitting: *Deleted

## 2020-01-14 NOTE — Telephone Encounter (Signed)
Primary Cardiologist:No primary care provider on file.  Chart reviewed as part of pre-operative protocol coverage. Because of Sheila Mcbride's past medical history and time since last visit, he/she will require a follow-up visit in order to better assess preoperative cardiovascular risk. She has not been seen by cardiology since 2017.   Pre-op covering staff: - Please schedule appointment and call patient to inform them. - Please contact requesting surgeon's office via preferred method (i.e, phone, fax) to inform them of need for appointment prior to surgery.  If applicable, this message will also be routed to pharmacy pool and/or primary cardiologist for input on holding anticoagulant/antiplatelet agent as requested below so that this information is available at time of patient's appointment.   Jory Sims, NP  01/14/2020, 1:24 PM

## 2020-01-14 NOTE — Telephone Encounter (Signed)
Pt have office visit with Dr Stanford Breed 01/15/2020

## 2020-01-14 NOTE — Telephone Encounter (Signed)
   [facility] Pre-operative Risk Assessment    Request for surgical clearance:  1. What type of surgery is being performed? Right breast lumpectomy   2. When is this surgery scheduled? TBD   3. What type of clearance is required (medical clearance vs. Pharmacy clearance to hold med vs. Both)? Medical  4. Are there any medications that need to be held prior to surgery and how long? None   5. Practice name and name of physician performing surgery? [facility] Dr Alphonsa Overall   6. What is your office phone number [phone]    7.   What is your office fax number [phone] ATTN: April Staton  8.   Anesthesia type (None, local, MAC, general) ? General   Barbaraann Barthel 01/14/2020, 12:33 PM  _________________________________________________________________   (provider comments below)

## 2020-01-15 ENCOUNTER — Encounter: Payer: Self-pay | Admitting: Cardiology

## 2020-01-15 ENCOUNTER — Ambulatory Visit: Payer: 59 | Admitting: Cardiology

## 2020-01-15 ENCOUNTER — Other Ambulatory Visit: Payer: Self-pay

## 2020-01-15 VITALS — BP 144/86 | HR 129 | Temp 96.8°F | Ht 61.0 in | Wt 112.0 lb

## 2020-01-15 DIAGNOSIS — R002 Palpitations: Secondary | ICD-10-CM

## 2020-01-15 DIAGNOSIS — Z01810 Encounter for preprocedural cardiovascular examination: Secondary | ICD-10-CM

## 2020-01-15 DIAGNOSIS — R918 Other nonspecific abnormal finding of lung field: Secondary | ICD-10-CM

## 2020-01-15 NOTE — Patient Instructions (Signed)
Medication Instructions:  NO CHANGE *If you need a refill on your cardiac medications before your next appointment, please call your pharmacy*   Lab Work: If you have labs (blood work) drawn today and your tests are completely normal, you will receive your results only by: Marland Kitchen MyChart Message (if you have MyChart) OR . A paper copy in the mail If you have any lab test that is abnormal or we need to change your treatment, we will call you to review the results.  Testing: CT WO CONTRAST TO FOLLOW UP ON LUNG NODULE AT [address]  Follow-Up: At [facility], you and your health needs are our priority.  As part of our continuing mission to provide you with exceptional heart care, we have created designated Provider Care Teams.  These Care Teams include your primary Cardiologist (physician) and Advanced Practice Providers (APPs -  Physician Assistants and Nurse Practitioners) who all work together to provide you with the care you need, when you need it.  We recommend signing up for the patient portal called "MyChart".  Sign up information is provided on this After Visit Summary.  MyChart is used to connect with patients for Virtual Visits (Telemedicine).  Patients are able to view lab/test results, encounter notes, upcoming appointments, etc.  Non-urgent messages can be sent to your provider as well.   To learn more about what you can do with MyChart, go to [email or web address].    Your next appointment:   12 month(s)  The format for your next appointment:   Either In Person or Virtual  Provider:   Kirk Ruths, MD

## 2020-01-20 ENCOUNTER — Other Ambulatory Visit: Payer: Self-pay | Admitting: Obstetrics & Gynecology

## 2020-01-20 MED ORDER — TRIAMCINOLONE ACETONIDE 0.025 % EX OINT: 1.0000 "application " | TOPICAL_OINTMENT | Freq: Two times a day (BID) | CUTANEOUS | 1 refills | Status: DC

## 2020-01-20 NOTE — Telephone Encounter (Signed)
Rx for triamcinolone ointment 0.025% bid for up to 7 days sent to pharmacy on file.  Please let pt know.  Thanks.

## 2020-01-20 NOTE — Telephone Encounter (Signed)
Patient notified that rx was sent to pharmacy & to not use it for more than 7 days at a time. Patient agrees.

## 2020-01-21 ENCOUNTER — Other Ambulatory Visit (HOSPITAL_COMMUNITY): Payer: Self-pay | Admitting: Surgery

## 2020-01-21 DIAGNOSIS — N6310 Unspecified lump in the right breast, unspecified quadrant: Secondary | ICD-10-CM

## 2020-01-22 ENCOUNTER — Telehealth: Payer: Self-pay | Admitting: Diagnostic Radiology

## 2020-01-22 NOTE — Telephone Encounter (Signed)
na

## 2020-02-03 ENCOUNTER — Encounter (HOSPITAL_BASED_OUTPATIENT_CLINIC_OR_DEPARTMENT_OTHER): Payer: Self-pay | Admitting: Surgery

## 2020-02-03 ENCOUNTER — Ambulatory Visit
Admission: RE | Admit: 2020-02-03 | Discharge: 2020-02-03 | Disposition: A | Discharge status: Home / Self Care | Payer: 59 | Source: Ambulatory Visit | Attending: Cardiology | Admitting: Cardiology

## 2020-02-03 ENCOUNTER — Other Ambulatory Visit: Payer: Self-pay

## 2020-02-03 ENCOUNTER — Other Ambulatory Visit: Payer: 59

## 2020-02-03 DIAGNOSIS — R918 Other nonspecific abnormal finding of lung field: Secondary | ICD-10-CM

## 2020-02-04 ENCOUNTER — Telehealth: Payer: Self-pay | Admitting: *Deleted

## 2020-02-04 ENCOUNTER — Encounter: Payer: Self-pay | Admitting: *Deleted

## 2020-02-04 NOTE — Telephone Encounter (Signed)
Left message for medical reocrds, I need to get a CD of a CT scan done at their facitily 08/2018 for dr Stanford Breed to review.

## 2020-02-09 ENCOUNTER — Other Ambulatory Visit (HOSPITAL_COMMUNITY)
Admission: RE | Admit: 2020-02-09 | Discharge: 2020-02-09 | Disposition: A | Discharge status: Home / Self Care | Payer: 59 | Source: Ambulatory Visit | Attending: Surgery | Admitting: Surgery

## 2020-02-09 DIAGNOSIS — Z01812 Encounter for preprocedural laboratory examination: Secondary | ICD-10-CM | POA: Diagnosis present

## 2020-02-09 DIAGNOSIS — Z20822 Contact with and (suspected) exposure to covid-19: Secondary | ICD-10-CM | POA: Insufficient documentation

## 2020-02-09 LAB — SARS CORONAVIRUS 2 (TAT 6-24 HRS): SARS Coronavirus 2: NEGATIVE

## 2020-02-09 NOTE — Progress Notes (Signed)

## 2020-02-10 NOTE — Telephone Encounter (Signed)
CD received and reviewed by Dr Stanford Breed, patient will need follow up CT scan in one year.

## 2020-02-10 NOTE — H&P (Signed)
Sheila Mcbride  Location: [facility] Patient #: [id number] DOB: [birth year] Married / Language: English / Race: White Female  History of Present Illness   The patient is a 59 year old female who presents with a complaint of breast lesion.  The PCP is Dr. Anastasia Pall  Sees Dr. Edwinna Areola for GYN  The patient was referred by Dr. Owens Shark She is by her self.  [The Covid-19 virus has disrupted normal medical care in [place] and across the nation. We have sometimes had to alter normal surgical/medical care to limit this epidemic and we have explained these changes to the patient.]  Her mother had premenopausal breast cancer and died from this breast cancer in her 34s. So Ms. Kotz has had annual mammograms and MRIs every 2 years for at least the last 15 years. Then because of her breast density, she went alternating with mammograms and MRIs. She's never felt any mass in her breast. She's never had a prior breast biopsy until this recent episode. She is not on hormone replacement.  She had a mammogram on 03/22/2019 which showed no abnormality, but breast density "c". She had breast MRI on 09/22/2019 which showed an area in the right breast - she had a biopsy which showed fibrocystic changes. She had another MRI on 12/25/2019 which questioned whether the area in the lateral right breast had been biopsied - so she underwent another biopsy. She had a right breast biopsy on 31 December 2019 ([id number]) which showed a complex sclerosing lesion with calcifications.  I discussed with her the findings. Because of her tachycardia, I think be wise that she be reevaluated by cardiology for surgery. Also will her hematoma to improve before surgery. She understands a small chance of a malignancy associated with this lesion.  Plan: 1. Right breast lumpectomy (seed localization) Will wait until the end of April/beginning of May for surgery to  allow hematoma to resolve, 2. Per op cardiac evaluation for tachycardia - Dr. Acie Fredrickson  Review of Systems as stated in this history (HPI) or in the review of systems. Otherwise all other 12 point ROS are negative  Past Medical History: 1. History of pheochromocytoma - [facility] - 1989 2. Tachycardia - Saw Dr. Joaquim Nam in 01/2016  Social History: Married. Has two children: Rodman Key, son, 20 yo, finishing critical care medicine in [place] - to start [employer] this fall, and daughter married to ENT resident Aaron Edelman Crenshaw's son mother in Sports coach) - in [place] Retired Patent attorney  The patient's family history - mother had premenopausal breast cancer  DATA REVIEWED, COUNSELING AND COORDINATION OF CARE: I have personally seen and evaluated the patient, evaluated laboratory and imaging results, formulated the assessment and plan and placed orders. This requires moderate/high medical decision making. Total time spent with patient and charting: 50 minutes    Past Surgical History (Chanel Teressa Senter, [place]; 01/09/2020 8:51 AM) Breast Biopsy  Right. Gallbladder Surgery - Laparoscopic   Diagnostic Studies History (Chanel Teressa Senter, CMA; 01/09/2020 8:51 AM) Colonoscopy  5-10 years ago Mammogram  within last year Pap Smear  1-5 years ago  Allergies (Chanel Teressa Senter, CMA; 01/09/2020 8:52 AM) Sulfa Antibiotics  EPINEPHrine *CHEMICALS*  Allergies Reconciled   Medication History ([place], CMA; 01/09/2020 8:52 AM) No Current Medications Medications Reconciled  Social History Antonietta Jewel, CMA; 01/09/2020 8:51 AM) Alcohol use  Occasional alcohol use. No caffeine use  No drug use  Tobacco use  Never smoker.  Family History Antonietta Jewel, [place]; 01/09/2020 8:51 AM) Breast Cancer  Mother.  Pregnancy / Birth History Antonietta Jewel, [place]; 01/09/2020 8:51 AM) Age at menarche  24 years. Age of menopause  <45 Gravida  2 Length (months) of breastfeeding  7-12 Maternal age  84-25 Para   2  Other Problems (Chanel Teressa Senter, [place]; 01/09/2020 8:51 AM) Cholelithiasis  Gastroesophageal Reflux Disease  Heart murmur  Lump In Breast     Review of Systems (Chanel Nolan CMA; 01/09/2020 8:51 AM) General Not Present- Appetite Loss, Chills, Fatigue, Fever, Night Sweats, Weight Gain and Weight Loss. Skin Not Present- Change in Wart/Mole, Dryness, Hives, Jaundice, New Lesions, Non-Healing Wounds, Rash and Ulcer. HEENT Not Present- Earache, Hearing Loss, Hoarseness, Nose Bleed, Oral Ulcers, Ringing in the Ears, Seasonal Allergies, Sinus Pain, Sore Throat, Visual Disturbances, Wears glasses/contact lenses and Yellow Eyes. Breast Present- Breast Mass. Not Present- Breast Pain, Nipple Discharge and Skin Changes. Cardiovascular Present- Rapid Heart Rate. Not Present- Chest Pain, Difficulty Breathing Lying Down, Leg Cramps, Palpitations, Shortness of Breath and Swelling of Extremities. Gastrointestinal Not Present- Abdominal Pain, Bloating, Bloody Stool, Change in Bowel Habits, Chronic diarrhea, Constipation, Difficulty Swallowing, Excessive gas, Gets full quickly at meals, Hemorrhoids, Indigestion, Nausea, Rectal Pain and Vomiting. Female Genitourinary Not Present- Frequency, Nocturia, Painful Urination, Pelvic Pain and Urgency. Musculoskeletal Not Present- Back Pain, Joint Pain, Joint Stiffness, Muscle Pain, Muscle Weakness and Swelling of Extremities. Neurological Not Present- Decreased Memory, Fainting, Headaches, Numbness, Seizures, Tingling, Tremor, Trouble walking and Weakness. Psychiatric Not Present- Anxiety, Bipolar, Change in Sleep Pattern, Depression, Fearful and Frequent crying. Endocrine Not Present- Cold Intolerance, Excessive Hunger, Hair Changes, Heat Intolerance, Hot flashes and New Diabetes. Hematology Not Present- Blood Thinners, Easy Bruising, Excessive bleeding, Gland problems, HIV and Persistent Infections.  Vitals (Chanel Nolan CMA; 01/09/2020 8:52 AM) 01/09/2020 8:52  AM Weight: 110.13 lb Height: 60.5in Body Surface Area: 1.46 m Body Mass Index: 21.15 kg/m  Temp.: 95.24F  Pulse: 141 (Regular)  BP: 124/72(Sitting, Left Arm, Standard)  Physical Exam  General: WN WF who isalert and generally healthy appearing. She is wearing a mask. HEENT: Normal. Pupils equal.  Neck: Supple. No mass. No thyroid mass.  Lymph Nodes: No supraclavicular or cervical nodes.  Lungs: Clear to auscultation and symmetric breath sounds. Heart: RRR. No murmur or rub.  Breasts: Right - She has bruising of the right breast laterally. She has an approximate 2.0 cm mass (hematoma) at the 9 o'clock position of the right breast Left - No mass or nodule  Abdomen: Soft. No mass. No tenderness. No hernia. Normal bowel sounds. Midline scar. Rectal: Not done.  Extremities: Good strength and ROM in upper and lower extremities.  Neurologic: Grossly intact to motor and sensory function. Psychiatric: Has normal mood and affect. Behavior is normal.  Assessment & Plan  1.  BREAST MASS, RIGHT (N63.10)  Story: Right breast biopsy on 31 December 2019 ([id number]) which showed a complex sclerosing lesion with calcifications.  Plan:  1. Right breast lumpectomy (seed localization) Will wait until the end of April/beginning of May for surgery to allow hematoma to resolve  2. Pre op cardiac evaluation for tachycardia - Dr. Acie Fredrickson  Seen by Dr. Stanford Breed on 01/15/2020  3. History of pheochromocytoma - [facility] - 1989  Alphonsa Overall, MD, [facility] Office phone:  [phone]

## 2020-02-11 ENCOUNTER — Other Ambulatory Visit: Payer: Self-pay

## 2020-02-11 ENCOUNTER — Ambulatory Visit
Admission: RE | Admit: 2020-02-11 | Discharge: 2020-02-11 | Disposition: A | Discharge status: Home / Self Care | Payer: 59 | Source: Ambulatory Visit | Attending: Surgery | Admitting: Surgery

## 2020-02-11 DIAGNOSIS — N6310 Unspecified lump in the right breast, unspecified quadrant: Secondary | ICD-10-CM

## 2020-02-12 ENCOUNTER — Other Ambulatory Visit: Payer: Self-pay

## 2020-02-12 ENCOUNTER — Ambulatory Visit (HOSPITAL_BASED_OUTPATIENT_CLINIC_OR_DEPARTMENT_OTHER): Payer: 59 | Admitting: Certified Registered Nurse Anesthetist

## 2020-02-12 ENCOUNTER — Encounter (HOSPITAL_BASED_OUTPATIENT_CLINIC_OR_DEPARTMENT_OTHER)
Admission: RE | Disposition: A | Discharge status: Home / Self Care | Payer: Self-pay | Source: Home / Self Care | Attending: Surgery

## 2020-02-12 ENCOUNTER — Ambulatory Visit (HOSPITAL_BASED_OUTPATIENT_CLINIC_OR_DEPARTMENT_OTHER)
Admission: RE | Admit: 2020-02-12 | Discharge: 2020-02-12 | Disposition: A | Discharge status: Home / Self Care | Payer: 59 | Attending: Surgery | Admitting: Surgery

## 2020-02-12 ENCOUNTER — Encounter (HOSPITAL_BASED_OUTPATIENT_CLINIC_OR_DEPARTMENT_OTHER): Payer: Self-pay | Admitting: Surgery

## 2020-02-12 ENCOUNTER — Ambulatory Visit
Admission: RE | Admit: 2020-02-12 | Discharge: 2020-02-12 | Disposition: A | Discharge status: Home / Self Care | Payer: 59 | Source: Ambulatory Visit | Attending: Surgery | Admitting: Surgery

## 2020-02-12 DIAGNOSIS — N6021 Fibroadenosis of right breast: Secondary | ICD-10-CM | POA: Insufficient documentation

## 2020-02-12 DIAGNOSIS — K219 Gastro-esophageal reflux disease without esophagitis: Secondary | ICD-10-CM | POA: Insufficient documentation

## 2020-02-12 DIAGNOSIS — Z882 Allergy status to sulfonamides status: Secondary | ICD-10-CM | POA: Diagnosis not present

## 2020-02-12 DIAGNOSIS — R011 Cardiac murmur, unspecified: Secondary | ICD-10-CM | POA: Insufficient documentation

## 2020-02-12 DIAGNOSIS — Z803 Family history of malignant neoplasm of breast: Secondary | ICD-10-CM | POA: Diagnosis not present

## 2020-02-12 DIAGNOSIS — N6310 Unspecified lump in the right breast, unspecified quadrant: Secondary | ICD-10-CM

## 2020-02-12 DIAGNOSIS — Z9049 Acquired absence of other specified parts of digestive tract: Secondary | ICD-10-CM | POA: Diagnosis not present

## 2020-02-12 DIAGNOSIS — Z888 Allergy status to other drugs, medicaments and biological substances status: Secondary | ICD-10-CM | POA: Diagnosis not present

## 2020-02-12 HISTORY — DX: Nausea with vomiting, unspecified: Z98.890

## 2020-02-12 HISTORY — PX: BREAST LUMPECTOMY WITH RADIOACTIVE SEED LOCALIZATION: SHX6424

## 2020-02-12 HISTORY — DX: Nausea with vomiting, unspecified: R11.2

## 2020-02-12 SURGERY — BREAST LUMPECTOMY WITH RADIOACTIVE SEED LOCALIZATION
Anesthesia: General | Site: Breast | Laterality: Right

## 2020-02-12 MED ORDER — BUPIVACAINE HCL (PF) 0.25 % IJ SOLN
INTRAMUSCULAR | Status: DC | PRN
  Administered 2020-02-12: 20 mL

## 2020-02-12 MED ORDER — CHLORHEXIDINE GLUCONATE 4 % EX LIQD: 60.0000 mL | Freq: Once | CUTANEOUS | Status: DC

## 2020-02-12 MED ORDER — OXYCODONE HCL 5 MG/5ML PO SOLN: 5.0000 mg | Freq: Once | ORAL | Status: DC | PRN

## 2020-02-12 MED ORDER — LIDOCAINE 2% (20 MG/ML) 5 ML SYRINGE
INTRAMUSCULAR | Status: DC | PRN
  Administered 2020-02-12: 40 mg via INTRAVENOUS

## 2020-02-12 MED ORDER — TRAMADOL HCL 50 MG PO TABS: 50.0000 mg | ORAL_TABLET | Freq: Four times a day (QID) | ORAL | 0 refills | Status: DC | PRN

## 2020-02-12 MED ORDER — SCOPOLAMINE 1 MG/3DAYS TD PT72
MEDICATED_PATCH | TRANSDERMAL | Status: AC
  Filled 2020-02-12: qty 1

## 2020-02-12 MED ORDER — CEFAZOLIN SODIUM-DEXTROSE 2-4 GM/100ML-% IV SOLN
2.0000 g | INTRAVENOUS | Status: AC
  Administered 2020-02-12: 2 g via INTRAVENOUS

## 2020-02-12 MED ORDER — CEFAZOLIN SODIUM-DEXTROSE 2-4 GM/100ML-% IV SOLN
INTRAVENOUS | Status: AC
  Filled 2020-02-12: qty 100

## 2020-02-12 MED ORDER — FENTANYL CITRATE (PF) 100 MCG/2ML IJ SOLN
INTRAMUSCULAR | Status: AC
  Filled 2020-02-12: qty 2

## 2020-02-12 MED ORDER — EPHEDRINE SULFATE 50 MG/ML IJ SOLN
INTRAMUSCULAR | Status: DC | PRN
  Administered 2020-02-12 (×3): 5 mg via INTRAVENOUS

## 2020-02-12 MED ORDER — ACETAMINOPHEN 325 MG PO TABS: 325.0000 mg | ORAL_TABLET | ORAL | Status: DC | PRN

## 2020-02-12 MED ORDER — ONDANSETRON HCL 4 MG/2ML IJ SOLN: 4.0000 mg | Freq: Once | INTRAMUSCULAR | Status: DC | PRN

## 2020-02-12 MED ORDER — MIDAZOLAM HCL 2 MG/2ML IJ SOLN
1.0000 mg | INTRAMUSCULAR | Status: DC | PRN
  Administered 2020-02-12: 2 mg via INTRAVENOUS

## 2020-02-12 MED ORDER — ONDANSETRON HCL 4 MG/2ML IJ SOLN
INTRAMUSCULAR | Status: DC | PRN
  Administered 2020-02-12: 4 mg via INTRAVENOUS

## 2020-02-12 MED ORDER — ONDANSETRON HCL 4 MG/2ML IJ SOLN
INTRAMUSCULAR | Status: AC
  Filled 2020-02-12: qty 2

## 2020-02-12 MED ORDER — FENTANYL CITRATE (PF) 100 MCG/2ML IJ SOLN
50.0000 ug | INTRAMUSCULAR | Status: AC | PRN
  Administered 2020-02-12: 25 ug via INTRAVENOUS
  Administered 2020-02-12: 50 ug via INTRAVENOUS
  Administered 2020-02-12: 25 ug via INTRAVENOUS

## 2020-02-12 MED ORDER — DEXAMETHASONE SODIUM PHOSPHATE 4 MG/ML IJ SOLN
INTRAMUSCULAR | Status: DC | PRN
  Administered 2020-02-12: 5 mg via INTRAVENOUS

## 2020-02-12 MED ORDER — SCOPOLAMINE 1 MG/3DAYS TD PT72
1.0000 | MEDICATED_PATCH | Freq: Once | TRANSDERMAL | Status: DC
  Administered 2020-02-12: 1.5 mg via TRANSDERMAL

## 2020-02-12 MED ORDER — FENTANYL CITRATE (PF) 100 MCG/2ML IJ SOLN: 25.0000 ug | INTRAMUSCULAR | Status: DC | PRN

## 2020-02-12 MED ORDER — MEPERIDINE HCL 25 MG/ML IJ SOLN: 6.2500 mg | INTRAMUSCULAR | Status: DC | PRN

## 2020-02-12 MED ORDER — OXYCODONE HCL 5 MG PO TABS: 5.0000 mg | ORAL_TABLET | Freq: Once | ORAL | Status: DC | PRN

## 2020-02-12 MED ORDER — LACTATED RINGERS IV SOLN: INTRAVENOUS | Status: DC

## 2020-02-12 MED ORDER — MIDAZOLAM HCL 2 MG/2ML IJ SOLN
INTRAMUSCULAR | Status: AC
  Filled 2020-02-12: qty 2

## 2020-02-12 MED ORDER — ACETAMINOPHEN 500 MG PO TABS
1000.0000 mg | ORAL_TABLET | ORAL | Status: AC
  Administered 2020-02-12: 1000 mg via ORAL

## 2020-02-12 MED ORDER — PROPOFOL 10 MG/ML IV BOLUS
INTRAVENOUS | Status: DC | PRN
  Administered 2020-02-12: 120 mg via INTRAVENOUS

## 2020-02-12 MED ORDER — ACETAMINOPHEN 500 MG PO TABS
ORAL_TABLET | ORAL | Status: AC
  Filled 2020-02-12: qty 2

## 2020-02-12 MED ORDER — ACETAMINOPHEN 160 MG/5ML PO SOLN: 325.0000 mg | ORAL | Status: DC | PRN

## 2020-02-12 MED ORDER — PROPOFOL 10 MG/ML IV BOLUS
INTRAVENOUS | Status: AC
  Filled 2020-02-12: qty 20

## 2020-02-12 SURGICAL SUPPLY — 50 items
BENZOIN TINCTURE PRP APPL 2/3 (GAUZE/BANDAGES/DRESSINGS) IMPLANT
BINDER BREAST LRG (GAUZE/BANDAGES/DRESSINGS) IMPLANT
BINDER BREAST MEDIUM (GAUZE/BANDAGES/DRESSINGS) ×2 IMPLANT
BINDER BREAST XLRG (GAUZE/BANDAGES/DRESSINGS) IMPLANT
BINDER BREAST XXLRG (GAUZE/BANDAGES/DRESSINGS) IMPLANT
BLADE HEX COATED 2.75 (ELECTRODE) IMPLANT
BLADE SURG 10 STRL SS (BLADE) IMPLANT
BLADE SURG 15 STRL LF DISP TIS (BLADE) ×1 IMPLANT
BLADE SURG 15 STRL SS (BLADE) ×2
CANISTER SUC SOCK COL 7IN (MISCELLANEOUS) IMPLANT
CANISTER SUCT 1200ML W/VALVE (MISCELLANEOUS) ×2 IMPLANT
CHLORAPREP W/TINT 26 (MISCELLANEOUS) ×2 IMPLANT
CLIP VESOCCLUDE SM WIDE 6/CT (CLIP) IMPLANT
COVER BACK TABLE 60X90IN (DRAPES) ×2 IMPLANT
COVER MAYO STAND STRL (DRAPES) ×2 IMPLANT
COVER PROBE W GEL 5X96 (DRAPES) ×2 IMPLANT
COVER WAND RF STERILE (DRAPES) IMPLANT
DECANTER SPIKE VIAL GLASS SM (MISCELLANEOUS) IMPLANT
DERMABOND ADVANCED (GAUZE/BANDAGES/DRESSINGS) ×1
DERMABOND ADVANCED .7 DNX12 (GAUZE/BANDAGES/DRESSINGS) ×1 IMPLANT
DRAPE LAPAROSCOPIC ABDOMINAL (DRAPES) ×2 IMPLANT
DRAPE UTILITY XL STRL (DRAPES) ×2 IMPLANT
DRSG PAD ABDOMINAL 8X10 ST (GAUZE/BANDAGES/DRESSINGS) ×2 IMPLANT
ELECT COATED BLADE 2.86 ST (ELECTRODE) IMPLANT
ELECT REM PT RETURN 9FT ADLT (ELECTROSURGICAL) ×2
ELECTRODE REM PT RTRN 9FT ADLT (ELECTROSURGICAL) ×1 IMPLANT
GAUZE SPONGE 4X4 12PLY STRL (GAUZE/BANDAGES/DRESSINGS) ×2 IMPLANT
GAUZE SPONGE 4X4 12PLY STRL LF (GAUZE/BANDAGES/DRESSINGS) ×2 IMPLANT
GLOVE SURG SYN 7.5  E (GLOVE) ×4
GLOVE SURG SYN 7.5 E (GLOVE) ×2 IMPLANT
GOWN STRL REUS W/ TWL LRG LVL3 (GOWN DISPOSABLE) ×1 IMPLANT
GOWN STRL REUS W/ TWL XL LVL3 (GOWN DISPOSABLE) ×1 IMPLANT
GOWN STRL REUS W/TWL LRG LVL3 (GOWN DISPOSABLE) ×2
GOWN STRL REUS W/TWL XL LVL3 (GOWN DISPOSABLE) ×2
KIT MARKER MARGIN INK (KITS) ×2 IMPLANT
NEEDLE HYPO 25X1 1.5 SAFETY (NEEDLE) ×2 IMPLANT
NS IRRIG 1000ML POUR BTL (IV SOLUTION) ×2 IMPLANT
PENCIL SMOKE EVACUATOR (MISCELLANEOUS) ×2 IMPLANT
SET BASIN DAY SURGERY F.S. (CUSTOM PROCEDURE TRAY) ×2 IMPLANT
SHEET MEDIUM DRAPE 40X70 STRL (DRAPES) IMPLANT
SLEEVE SCD COMPRESS KNEE MED (MISCELLANEOUS) ×2 IMPLANT
SPONGE LAP 18X18 RF (DISPOSABLE) ×2 IMPLANT
STRIP CLOSURE SKIN 1/4X4 (GAUZE/BANDAGES/DRESSINGS) IMPLANT
SUT MNCRL AB 4-0 PS2 18 (SUTURE) ×2 IMPLANT
SUT VICRYL 3-0 CR8 SH (SUTURE) ×2 IMPLANT
SYR CONTROL 10ML LL (SYRINGE) ×2 IMPLANT
TOWEL GREEN STERILE FF (TOWEL DISPOSABLE) ×2 IMPLANT
TRAY FAXITRON CT DISP (TRAY / TRAY PROCEDURE) ×2 IMPLANT
TUBE CONNECTING 20X1/4 (TUBING) ×2 IMPLANT
YANKAUER SUCT BULB TIP NO VENT (SUCTIONS) ×2 IMPLANT

## 2020-02-12 NOTE — Transfer of Care (Signed)
Immediate Anesthesia Transfer of Care Note  Patient: Sheila Mcbride  Procedure(s) Performed: RIGHT BREAST LUMPECTOMY WITH RADIOACTIVE SEED LOCALIZATION (Right Breast)  Patient Location: PACU  Anesthesia Type:General  Level of Consciousness: awake, alert  and oriented  Airway & Oxygen Therapy: Patient Spontanous Breathing and Patient connected to face mask oxygen  Post-op Assessment: Report given to RN and Post -op Vital signs reviewed and stable  Post vital signs: Reviewed and stable  Last Vitals:  Vitals Value Taken Time  BP 126/66 02/12/20 1632  Temp    Pulse 99 02/12/20 1633  Resp 22 02/12/20 1633  SpO2 100 % 02/12/20 1633  Vitals shown include unvalidated device data.  Last Pain:  Vitals:   02/12/20 1335  TempSrc: Temporal  PainSc: 0-No pain      Patients Stated Pain Goal: 1 ([id number])  Complications: No apparent anesthesia complications

## 2020-02-12 NOTE — Interval H&P Note (Signed)
History and Physical Interval Note:  02/12/2020 3:12 PM  Sheila Mcbride  has presented today for surgery, with the diagnosis of right breast complex sclerosing lesion.  The various methods of treatment have been discussed with the patient and family.   Seed in place.  Husband here with patient.  After consideration of risks, benefits and other options for treatment, the patient has consented to  Procedure(s): RIGHT BREAST LUMPECTOMY WITH RADIOACTIVE SEED LOCALIZATION (Right) as a surgical intervention.  The patient's history has been reviewed, patient examined, no change in status, stable for surgery.  I have reviewed the patient's chart and labs.  Questions were answered to the patient's satisfaction.     Shann Medal

## 2020-02-12 NOTE — Anesthesia Procedure Notes (Signed)
Procedure Name: LMA Insertion Date/Time: 02/12/2020 3:30 PM Performed by: Maryella Shivers, CRNA Pre-anesthesia Checklist: Patient identified, Emergency Drugs available, Suction available and Patient being monitored Patient Re-evaluated:Patient Re-evaluated prior to induction Oxygen Delivery Method: Circle system utilized Preoxygenation: Pre-oxygenation with 100% oxygen Induction Type: IV induction Ventilation: Mask ventilation without difficulty LMA: LMA inserted LMA Size: 4.0 Number of attempts: 1 Airway Equipment and Method: Bite block Placement Confirmation: positive ETCO2 Tube secured with: Tape Dental Injury: Teeth and Oropharynx as per pre-operative assessment

## 2020-02-12 NOTE — Anesthesia Postprocedure Evaluation (Signed)
Anesthesia Post Note  Patient: Sheila Mcbride  Procedure(s) Performed: RIGHT BREAST LUMPECTOMY WITH RADIOACTIVE SEED LOCALIZATION (Right Breast)     Patient location during evaluation: PACU Anesthesia Type: General Level of consciousness: awake and alert Pain management: pain level controlled Vital Signs Assessment: post-procedure vital signs reviewed and stable Respiratory status: spontaneous breathing, nonlabored ventilation, respiratory function stable and patient connected to nasal cannula oxygen Cardiovascular status: blood pressure returned to baseline and stable Postop Assessment: no apparent nausea or vomiting Anesthetic complications: no    Last Vitals:  Vitals:   02/12/20 1645 02/12/20 1700  BP: 117/89 135/78  Pulse: 90 83  Resp: 16 17  Temp:    SpO2: 100% 100%    Last Pain:  Vitals:   02/12/20 1700  TempSrc:   PainSc: 3                  Bemnet Trovato

## 2020-02-12 NOTE — Op Note (Addendum)
02/12/2020  4:22 PM  PATIENT:  Sheila Mcbride DOB: 1961-04-21 MRN: [id number]  PREOP DIAGNOSIS:   Right breast complex sclerosing lesion  POSTOP DIAGNOSIS:    Right breast complex sclerosing lesion, 9 o'clock position   PROCEDURE:   Procedure(s):  RIGHT BREAST LUMPECTOMY WITH RADIOACTIVE SEED LOCALIZATION  SURGEON:   Alphonsa Overall, M.D.  ANESTHESIA:   General  Anesthesiologist: Janeece Riggers, MD CRNA: Maryella Shivers, CRNA  General  EBL:  Minimal  ml  DRAINS:  none   LOCAL MEDICATIONS USED:   20 cc 1/4% marcaine  SPECIMEN:   right breast lumpectomy (6 color paint kit)  COUNTS CORRECT:  YES  INDICATIONS FOR PROCEDURE:  Sheila Mcbride is a 59 y.o. (DOB: [birth year]) white female whose primary care physician is Chesley Noon, MD and comes for right breast lumpectomy.   She had a biopsy of her right breast on 12/31/2019 which showed a complex sclerosing lesion.  She now comes for removal of this area of her right breast.  The indications and potential complications of surgery were explained to the patient. Potential complications include, but are not limited to, bleeding, infection, the need for further surgery, and nerve injury.     She had a I131 seed placed on 02/11/2020 in her right breast at [facility].  The seed is in the 9 o'clock position of the right breast.  OPERATIVE NOTE:   The patient was taken to operating room # 8 at [facility] where she underwent a general anesthesia  supervised by Anesthesiologist: Janeece Riggers, MD CRNA: Maryella Shivers, CRNA. Her right breast and axilla were prepped with  ChloraPrep and sterilely draped.    A time-out was held and the surgical check list was reviewed.    The lesionwas about at the 9 o'clock position of the right breast.   It was 2 cm from the areola.  Because the mass was just under the skin, I made an incision directly over the seed.  I used the Neoprobe to identify the I131 seed.  I tried to  excise an area around the tumor of at least 1 cm.    I excised this block of breast tissue approximately 2 cm by 2 cm  in diameter.   I painted the lumpectomy specimen with the 6 color paint kit and did a specimen mammogram which confirmed the mass, clip, and the seed were all in the right position in the specimen.  The specimen was sent to pathology who called back to confirm that they have the seed and the specimen.   I then irrigated the wound with saline. I infiltrated approximately 20 mL of 1/4% Marcaine in the incision.   I then closed the wound in layers using 3-0 Vicryl sutures for the deep layer. At the skin, I closed the incisions with a 4-0 Monocryl suture. The incision  was then painted with Dermabond.  She had gauze place over the wounds and placed in a breast binder.   The patient tolerated the procedure well, was transported to the recovery room in good condition. Sponge and needle count were correct at the end of the case.   Final pathology is pending.   Right breast lumpectomy   Alphonsa Overall, MD, [facility] Pager: [phone] Office phone:  [phone]

## 2020-02-12 NOTE — Discharge Instructions (Signed)
No Tylenol until 8:00pm if needed   Post Anesthesia Home Care Instructions  Activity: Get plenty of rest for the remainder of the day. A responsible individual must stay with you for 24 hours following the procedure.  For the next 24 hours, DO NOT: -Drive a car -Paediatric nurse -Drink alcoholic beverages -Take any medication unless instructed by your physician -Make any legal decisions or sign important papers.  Meals: Start with liquid foods such as gelatin or soup. Progress to regular foods as tolerated. Avoid greasy, spicy, heavy foods. If nausea and/or vomiting occur, drink only clear liquids until the nausea and/or vomiting subsides. Call your physician if vomiting continues.  Special Instructions/Symptoms: Your throat may feel dry or sore from the anesthesia or the breathing tube placed in your throat during surgery. If this causes discomfort, gargle with warm salt water. The discomfort should disappear within 24 hours.  If you had a scopolamine patch placed behind your ear for the management of post- operative nausea and/or vomiting:  1. The medication in the patch is effective for 72 hours, after which it should be removed.  Wrap patch in a tissue and discard in the trash. Wash hands thoroughly with soap and water. 2. You may remove the patch earlier than 72 hours if you experience unpleasant side effects which may include dry mouth, dizziness or visual disturbances. 3. Avoid touching the patch. Wash your hands with soap and water after contact with the patch.  [facility] - DISCHARGE INSTRUCTIONS TO PATIENT  Activity:  Driving - May drive in one or 2 days, if doing well   Lifting - No lifting more than 14 pounds for 5 days, then no lmit                       Practice your Covid-19 protection:  Wear a mask, social distance, and wash your hands frequently  Wound Care:   Leave the incision dry for 2 days, then you may shower.  Place an ice pack over your wound for  pain control.  Diet:  As tolerated  Follow up appointment:  Call Dr. Pollie Friar office [facility]) at [phone] for an appointment in 2 to 4 weeks..  Medications and dosages:  Resume your home medications.  You have a prescription for:  Ultram  Call Dr. Lucia Gaskins or his office  [phone]) if you have:  Temperature greater than 100.4,  Persistent nausea and vomiting,  Severe uncontrolled pain,  Redness, tenderness, or signs of infection (pain, swelling, redness, odor or green/yellow discharge around the site),  Any other questions or concerns you may have after discharge.  In an emergency, call 911 or go to an Emergency Department at a nearby hospital.

## 2020-02-12 NOTE — Anesthesia Preprocedure Evaluation (Addendum)
Anesthesia Evaluation  Patient identified by MRN, date of birth, ID band Patient awake    Reviewed: Allergy & Precautions, H&P , NPO status , Patient's Chart, lab work & pertinent test results, reviewed documented beta blocker date and time   History of Anesthesia Complications (+) PONV and history of anesthetic complications  Airway Mallampati: I  TM Distance: >3 FB Neck ROM: full    Dental no notable dental hx. (+) Teeth Intact, Dental Advisory Given   Pulmonary neg pulmonary ROS,    Pulmonary exam normal breath sounds clear to auscultation       Cardiovascular Exercise Tolerance: Good negative cardio ROS   Rhythm:regular Rate:Normal     Neuro/Psych negative neurological ROS  negative psych ROS   GI/Hepatic negative GI ROS, Neg liver ROS,   Endo/Other  negative endocrine ROS  Renal/GU negative Renal ROS  negative genitourinary   Musculoskeletal   Abdominal   Peds  Hematology negative hematology ROS (+)   Anesthesia Other Findings   Reproductive/Obstetrics negative OB ROS                            Anesthesia Physical Anesthesia Plan  ASA: II  Anesthesia Plan: General   Post-op Pain Management:    Induction: Intravenous  PONV Risk Score and Plan: 3 and Ondansetron, Dexamethasone, Treatment may vary due to age or medical condition, Midazolam and Scopolamine patch - Pre-op  Airway Management Planned: LMA and Oral ETT  Additional Equipment:   Intra-op Plan:   Post-operative Plan:   Informed Consent: I have reviewed the patients History and Physical, chart, labs and discussed the procedure including the risks, benefits and alternatives for the proposed anesthesia with the patient or authorized representative who has indicated his/her understanding and acceptance.     Dental Advisory Given  Plan Discussed with: CRNA, Anesthesiologist and Surgeon  Anesthesia Plan  Comments: ( )        Anesthesia Quick Evaluation

## 2020-02-16 LAB — SURGICAL PATHOLOGY

## 2020-05-14 ENCOUNTER — Telehealth: Payer: Self-pay

## 2020-05-14 NOTE — Telephone Encounter (Signed)
Left message for pt to return call to triage RN. 

## 2020-05-14 NOTE — Telephone Encounter (Signed)
Patient returned call

## 2020-05-14 NOTE — Progress Notes (Signed)
GYNECOLOGY  VISIT  CC:   Discuss mammogram and follow up lichen sclerosus  HPI: 59 y.o. G2P2 Married White or Caucasian female here for consult to discuss mmg & follow up on lichen sclerosus.  Pt has hx of MRI 12/25/2019.  Had biopsy 12/31/2019.  Biopsy showed sclerosing lesion.  Lumpectomy was done showing on fibrocystic changes with calcifications.  She is questioning whether she had the marker for the lumpectomy was in the correct location.      Has been using coconut oil and this has been good for the skin.  Still having significant pain with intercourse.  Vaginal premarin cream did really help.  Not sure if she wants to use this again.  Questions about whether MMG this summer should be diagnostic instead of screening.  Is having new perinal itching the last few days.  Is not using any steroid or coconut oil in this location.    GYNECOLOGIC HISTORY: Patient's last menstrual period was 10/16/2006. Contraception: husband vasectomy Menopausal hormone therapy: none  Patient Active Problem List   Diagnosis Date Noted  . Pulmonary nodules/lesions, multiple 08/29/2018  . Closed nondisplaced fracture of shaft of fifth metacarpal bone of left hand with routine healing 01/14/2018  . History of pheochromocytoma 12/16/2015  . Sinus tachycardia 12/16/2015  . Skipped heart beats 11/16/2015  . 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 H/O pheochromocytoma   . H/O [facility] spotted fever   . MVP (mitral valve prolapse)   . PONV (postoperative nausea and vomiting)   . Rash    all over body in 03/13/? etiology  . TMJ (temporomandibular joint syndrome)     Past Surgical History:  Procedure Laterality Date  . BREAST LUMPECTOMY WITH RADIOACTIVE SEED LOCALIZATION Right 02/12/2020   Procedure: RIGHT BREAST LUMPECTOMY WITH RADIOACTIVE SEED LOCALIZATION;  Surgeon: Alphonsa Overall, MD;  Location: [facility];  Service: General;  Laterality: Right;   . CHOLECYSTECTOMY     and exploratory surgery in pelvic area  . DILATION AND CURETTAGE OF UTERUS  6/04  . pheochromocytoma     removal in  1990  . WISDOM TOOTH EXTRACTION      MEDS:   Current Outpatient Medications on File Prior to Visit  Medication Sig Dispense Refill  . Acetaminophen (TYLENOL 8 HOUR PO) Take 2 capsules by mouth as needed (AS NEEDED FOR HEADACHE).     Marland Kitchen metoprolol tartrate (LOPRESSOR) 25 MG tablet Take 0.5 tablets (12.5 mg total) by mouth 2 (two) times daily as needed. 30 tablet 2  . Multiple Vitamin (MULTIVITAMIN) tablet Take 1 tablet by mouth.     . triamcinolone (KENALOG) 0.025 % ointment Apply 1 application topically 2 (two) times daily.     No current facility-administered medications on file prior to visit.    ALLERGIES: Sulfa antibiotics and Epinephrine  Family History  Problem Relation Age of Onset  . Breast cancer Mother 53       deceased age 67 from blood clot in lung secondary complications in breast cancer treatment  . Heart disease Paternal Grandfather   . Osteoporosis Maternal Grandmother   . Rheum arthritis Maternal Grandmother   . Bladder Cancer Maternal Grandfather     SH:  Married, non smoker  Review of Systems  Constitutional: Negative.   HENT: Negative.   Eyes: Negative.   Respiratory: Negative.   Cardiovascular: Negative.   Gastrointestinal: Negative.   Endocrine: Negative.   Genitourinary: Negative.   Musculoskeletal: Negative.  Skin: Negative.   Allergic/Immunologic: Negative.   Neurological: Negative.   Hematological: Negative.   Psychiatric/Behavioral: Negative.     PHYSICAL EXAMINATION:    BP 114/62   Pulse 82   Resp 16   Wt 112 lb (50.8 kg)   LMP 10/16/2006   BMI 21.16 kg/m     General appearance: alert, cooperative and appears stated age Lymph:  no inguinal LAD noted  Pelvic: External genitalia:  Hypopigmentation of inner labia major down around rectum, tissue appears more normal in appearance, no discrete  lesions noted              Urethra:  normal appearing urethra with no masses, tenderness or lesions              Bartholins and Skenes: normal                 Anus:  no lesions  Chaperone, Royal Hawthorn, CMA, was present for exam.  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  Pt wondering if should be diagnostic. MRI due in December Advised pt to use topical steroid around perirectal tissue.  We discussed using mometasone 0.1% ointment twice weekly for maintenance.  Rx to pharmacy.    40 minutes spent with pt in total discussing concerns, reviewing records, and discussing options.

## 2020-05-14 NOTE — Telephone Encounter (Addendum)
AEX 07/2019, next scheduled for 11/800 H/O Lichen sclerosis.  Breast biopsy sclerosing lesion, had lumpectomy=results negative.   Spoke with pt. Pt calling to ask questions re:  1. Pt asking when can have next MMG, breast surgeon suggested Aug, Sept 2021. Last screening MMG was 03/2019. Pt states wasn't ready to have screening MMG again in 03/2020 due to not healed from lumpectomy.   2. Pt wanting to have Josph Macho touch and wanting to have Dr Sabra Heck examine her to see if still eligible to have. Pt denies any vaginal itching or issues at this time. Pt states is using coconut oil for vaginal dryness. Pt states wanting to know if can return to using Premarin since lumpectomy path was negative?   Advised to have OV to discuss options, treatment and MMG schedule. Pt agreeable. Pt scheduled OV with Dr Sabra Heck on 05/17/20 at 4 pm. Pt verbalized understanding of date and time of appt.   Routing to Dr Sabra Heck.  Encounter closed.

## 2020-05-14 NOTE — Telephone Encounter (Signed)
Patient is calling to discuss current health issues and advice on mammograms.

## 2020-05-17 ENCOUNTER — Other Ambulatory Visit: Payer: Self-pay

## 2020-05-17 ENCOUNTER — Ambulatory Visit: Payer: 59 | Admitting: Obstetrics & Gynecology

## 2020-05-17 ENCOUNTER — Encounter: Payer: Self-pay | Admitting: Obstetrics & Gynecology

## 2020-05-17 VITALS — BP 114/62 | HR 82 | Resp 16 | Wt 112.0 lb

## 2020-05-17 DIAGNOSIS — N6489 Other specified disorders of breast: Secondary | ICD-10-CM | POA: Diagnosis not present

## 2020-05-17 DIAGNOSIS — L900 Lichen sclerosus et atrophicus: Secondary | ICD-10-CM | POA: Diagnosis not present

## 2020-05-17 DIAGNOSIS — N6019 Diffuse cystic mastopathy of unspecified breast: Secondary | ICD-10-CM

## 2020-05-17 DIAGNOSIS — Z9189 Other specified personal risk factors, not elsewhere classified: Secondary | ICD-10-CM

## 2020-05-17 MED ORDER — MOMETASONE FUROATE 0.1 % EX OINT: TOPICAL_OINTMENT | CUTANEOUS | 1 refills | Status: DC

## 2020-05-20 DIAGNOSIS — Z9189 Other specified personal risk factors, not elsewhere classified: Secondary | ICD-10-CM | POA: Insufficient documentation

## 2020-06-06 IMAGING — MR MR BILATERAL BREAST WITHOUT AND WITH CONTRAST
8 of 12 series · 33 of 48 positions shown · IV contrast (multihance)
Comparison: Screening mammogram dated 12/17/2017 and bilateral
breast MRI dated 05/28/2017.

CLINICAL DATA: Family history of breast cancer with her mother
diagnosed at age 33. Mammographically dense breasts.

LABS:  None obtained on site today.
EXAM:
BILATERAL BREAST MRI WITH AND WITHOUT CONTRAST
TECHNIQUE: Multiplanar, multisequence MR images of both breasts were obtained
prior to and following the intravenous administration of 10 ml of
MultiHance.
Three-dimensional MR images were rendered by post-processing the
original MR data using the DynaCAD thin client. The 3D MR images are
interpreted and the findings are included in the complete MRI report
below.

[Series 2: t2_tirm_tra ipat (a-p) · axial · 3.0mm · 0.64mm/px · 1 of 54 slices shown]
[im 1/54]
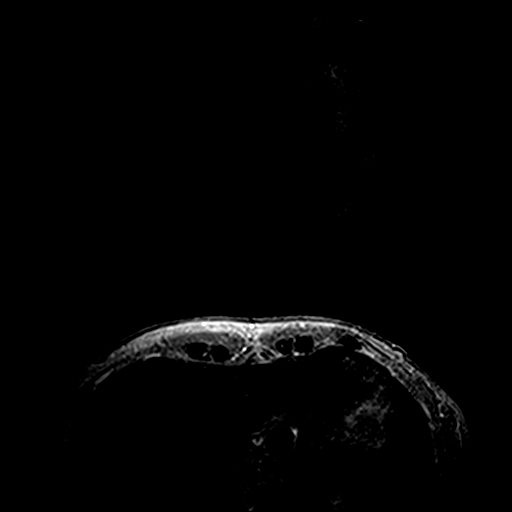

[Series 3: fl3d pre-cm no · axial · non-contrast · 1.2mm · 0.86mm/px · z∈[-86,+86]mm · 5 of 144 slices shown]
[im 1/144]
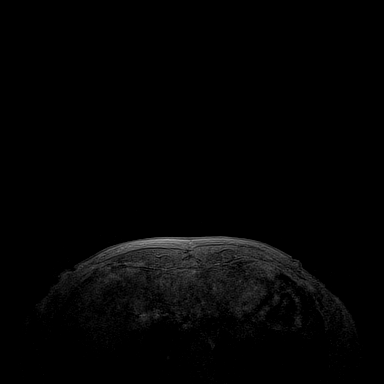
[im 36/144]
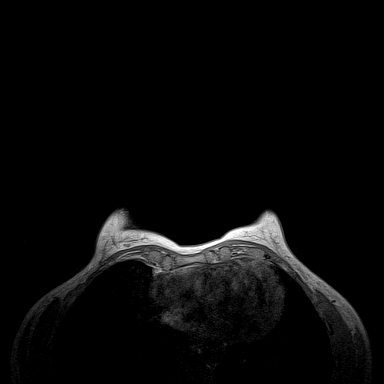
[im 72/144]
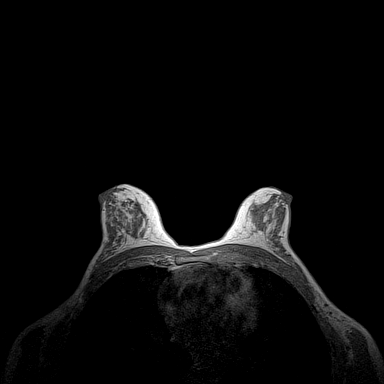
[im 108/144]
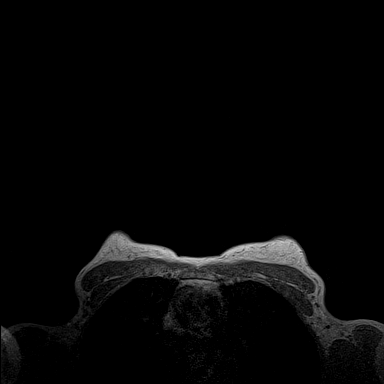
[im 144/144]
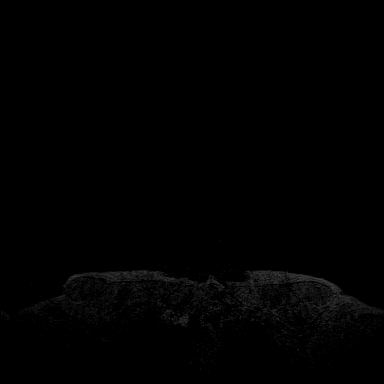

[Series 4: fl3d pre-cm · axial · non-contrast · 1.2mm · 0.86mm/px · z∈[-86,+86]mm · 5 of 144 slices shown]
[im 1/144]
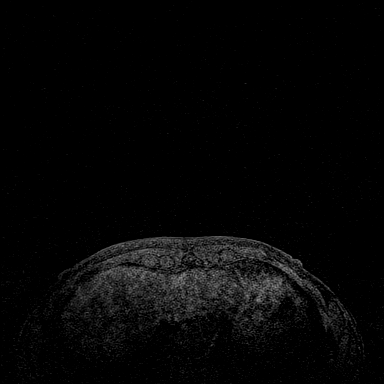
[im 36/144]
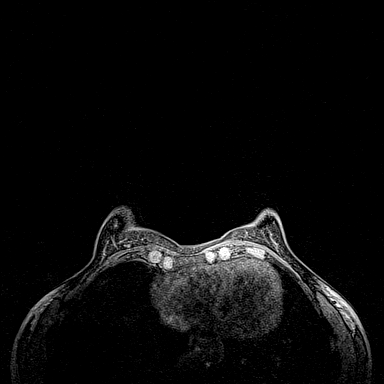
[im 72/144]
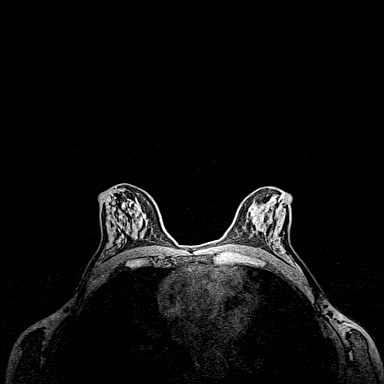
[im 108/144]
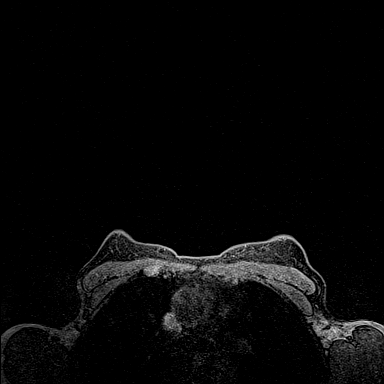
[im 144/144]
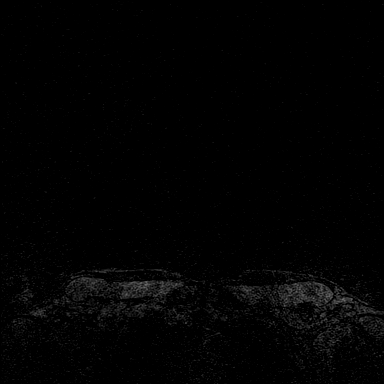

[Series 5: fl3d post-cm 20 · axial · 1.2mm · 0.86mm/px · z∈[-86,+86]mm · 5 of 144 slices shown (1 of 3)]
[im 1/144]
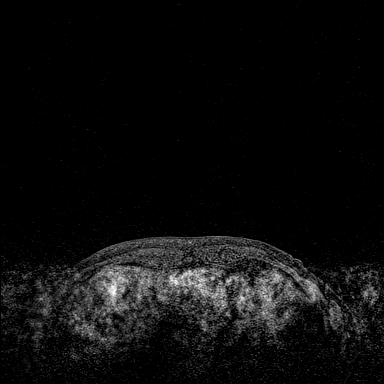
[im 36/144]
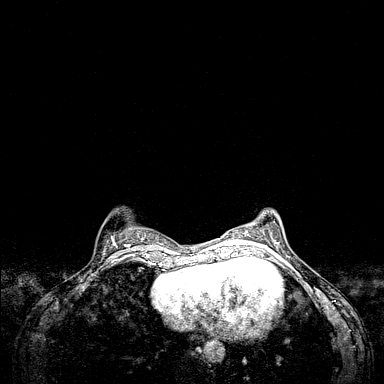
[im 72/144]
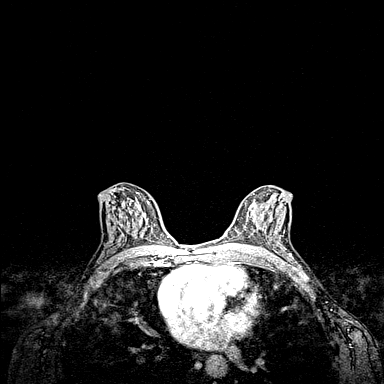
[im 108/144]
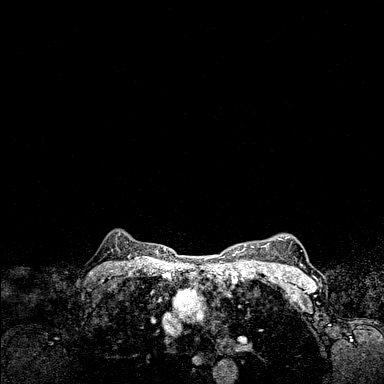
[im 144/144]
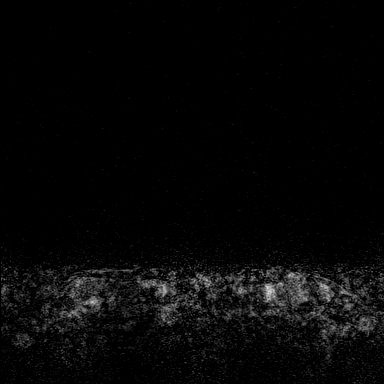

[Series 6: fl3d post-cm 20 · axial · 1.2mm · 0.86mm/px · z∈[-86,+86]mm · 5 of 144 slices shown (2 of 3)]
[im 1/144]
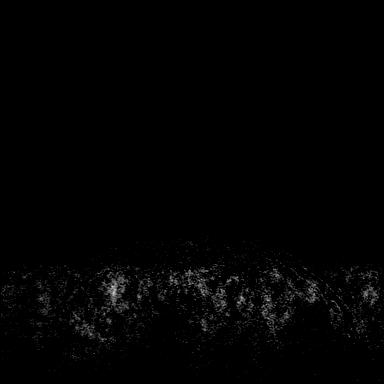
[im 36/144]
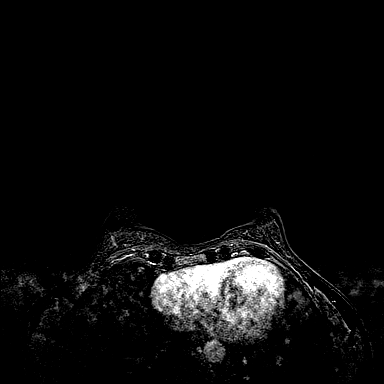
[im 72/144]
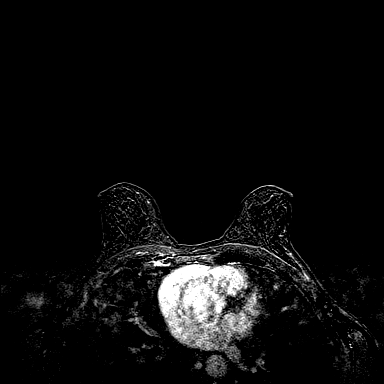
[im 108/144]
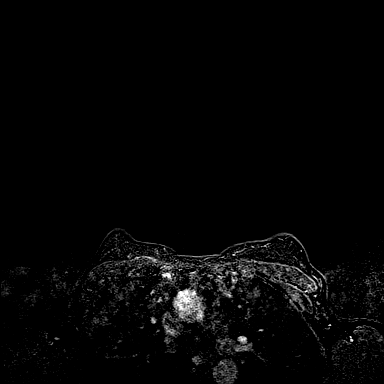
[im 144/144]
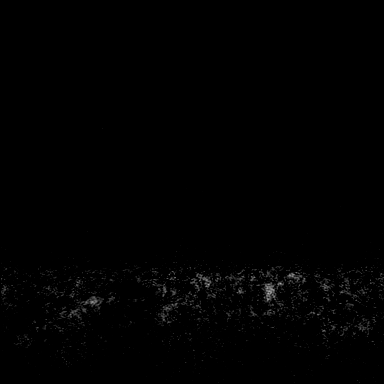

[Series 7: fl3d post-cm 20 · axial · 172.8mm · 0.86mm/px · 1 of 1 slices shown (3 of 3)]
[im 1/1]
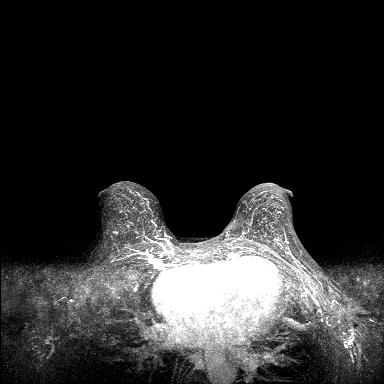

[Series 8: fl3d post-cm 3min · axial · 1.2mm · 0.86mm/px · z∈[-86,+86]mm · 6 of 144 slices shown]
[im 1/144]
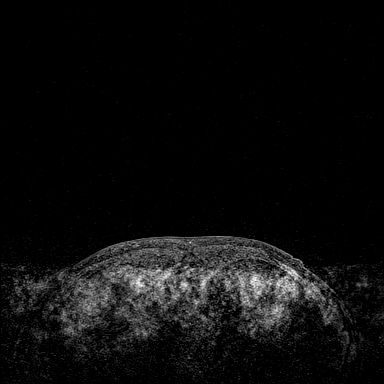
[im 29/144]
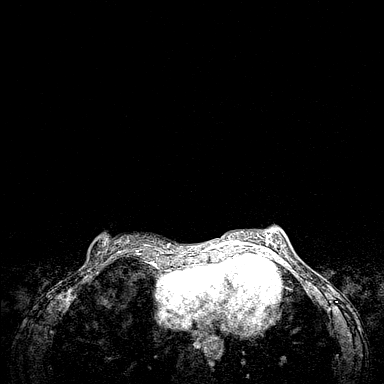
[im 58/144]
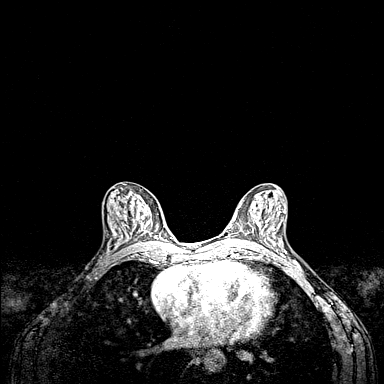
[im 86/144]
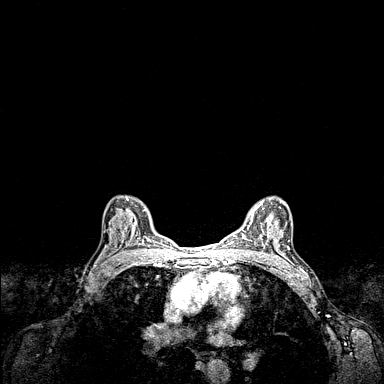
[im 115/144]
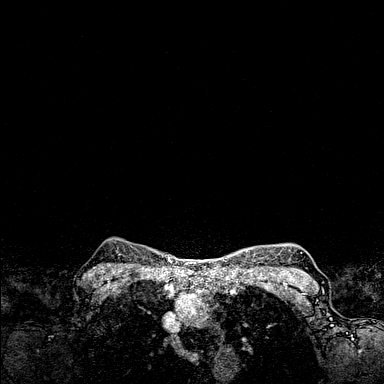
[im 144/144]
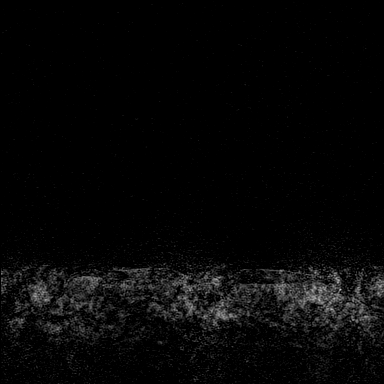

[Series 9: fl3d post-cm 3min_sub · axial · 1.2mm · 0.86mm/px · z∈[-86,+51]mm · 5 of 144 slices shown]
[im 1/144]
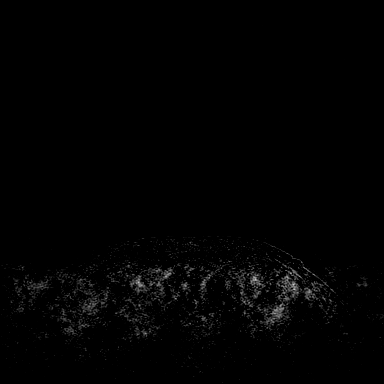
[im 29/144]
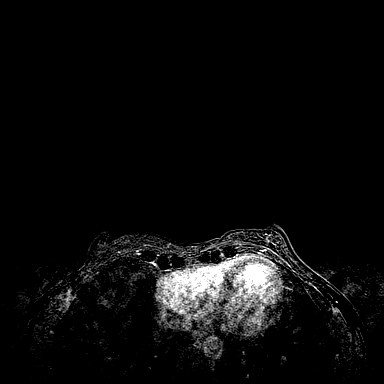
[im 58/144]
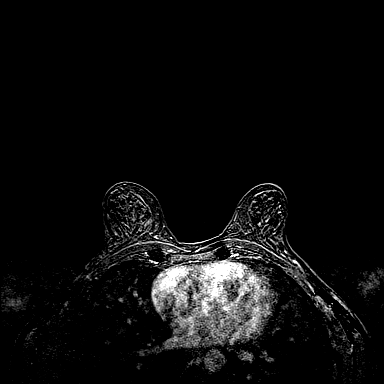
[im 86/144]
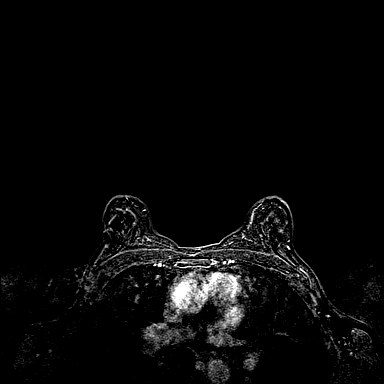
[im 115/144]
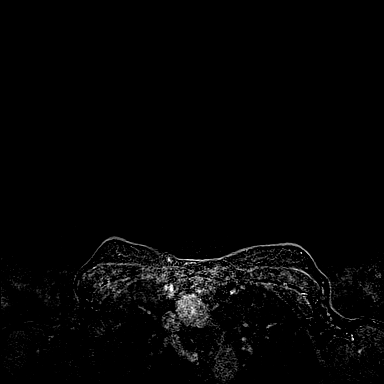

[33 of 48 positions shown; findings below may reference images not displayed]

FINDINGS: Breast composition: d. Extreme fibroglandular tissue.

Background parenchymal enhancement: Mild.

Right breast: Multiple small cysts. No mass or abnormal enhancement.

Left breast: Small retroareolar cyst. No mass or abnormal
enhancement.

Lymph nodes: No abnormal appearing lymph nodes.

Ancillary findings:  None.
IMPRESSION: 1. No evidence of malignancy.
2. Small bilateral benign breast cysts.

RECOMMENDATION:
1. Bilateral screening mammogram in 6 months when due.
2. Per American Cancer Society guidelines, if the patient has a
calculated lifetime risk of developing breast cancer of greater than
20%, annual screening MRI of the breasts would also be recommended.

BI-RADS CATEGORY  2: Benign.

## 2020-06-07 ENCOUNTER — Encounter: Payer: Self-pay | Admitting: Obstetrics & Gynecology

## 2020-06-10 ENCOUNTER — Other Ambulatory Visit: Payer: Self-pay | Admitting: Obstetrics & Gynecology

## 2020-06-10 ENCOUNTER — Telehealth: Payer: Self-pay

## 2020-06-10 DIAGNOSIS — Z803 Family history of malignant neoplasm of breast: Secondary | ICD-10-CM

## 2020-06-10 DIAGNOSIS — N6019 Diffuse cystic mastopathy of unspecified breast: Secondary | ICD-10-CM

## 2020-06-10 DIAGNOSIS — Z9189 Other specified personal risk factors, not elsewhere classified: Secondary | ICD-10-CM

## 2020-06-10 NOTE — Telephone Encounter (Signed)
Pt sent following Mychart message:  Bucy, Honestie Kulik [facility] Hi Dr. Sabra Heck  I was in on 8/2 to discuss:  1. the results of my lumpectomy (fibrocystic tissue)compared to the results of the biopsy (radial scar).  Did you have a chance to discuss with Dr. Owens Shark?  I was wondering if a diagnostic mammogram would be prudent? (My annual mammogram was due in June- Dr Lucia Gaskins advised we wait until August to allow healing from lumpectomy)   2. We talked about the [facility] procedure.  Where do you suggest I have this done?   3. We discussed a new medication for the lichens sclerosis. Where was this prescription filled?   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   All is Garielle, [facility]

## 2020-06-10 NOTE — Telephone Encounter (Signed)
Called pt personally.  She is aware I have spoke with Dr. Nolon Nations who feels very comfortable that the excisional biopsy was done in the correct location and that she just needs her MMG and MRI as would be typically planned.  Pt is going to call to scheduled MMG now.  Pt would like to try to get back to doing MRI in December.  Would like to see if could be precerted for December this year.  Aware insurance may not approve this year due to already having this done.  Pt aware rx for mometasone sent to pharmacy 05/17/2021.  She will follow up with [place] about this.  Names given for providers who do [facility] touch.  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    Thanks.

## 2020-06-11 NOTE — Telephone Encounter (Signed)
Breast MRI orders placed. Pt would like scheduled in Dec 2021. Cc: Hayley for precert  Encounter closed.

## 2020-06-15 ENCOUNTER — Other Ambulatory Visit: Payer: Self-pay | Admitting: Obstetrics & Gynecology

## 2020-06-15 DIAGNOSIS — Z0000 Encounter for general adult medical examination without abnormal findings: Secondary | ICD-10-CM

## 2020-06-22 ENCOUNTER — Other Ambulatory Visit: Payer: Self-pay

## 2020-06-22 ENCOUNTER — Ambulatory Visit
Admission: RE | Admit: 2020-06-22 | Discharge: 2020-06-22 | Disposition: A | Discharge status: Home / Self Care | Payer: 59 | Source: Ambulatory Visit | Attending: Obstetrics & Gynecology | Admitting: Obstetrics & Gynecology

## 2020-06-22 DIAGNOSIS — Z0000 Encounter for general adult medical examination without abnormal findings: Secondary | ICD-10-CM

## 2020-06-29 ENCOUNTER — Ambulatory Visit: Payer: 59

## 2020-09-15 ENCOUNTER — Other Ambulatory Visit: Payer: Self-pay

## 2020-09-15 ENCOUNTER — Ambulatory Visit
Admission: RE | Admit: 2020-09-15 | Discharge: 2020-09-15 | Disposition: A | Discharge status: Home / Self Care | Payer: 59 | Source: Ambulatory Visit | Attending: Obstetrics & Gynecology | Admitting: Obstetrics & Gynecology

## 2020-09-15 DIAGNOSIS — N6019 Diffuse cystic mastopathy of unspecified breast: Secondary | ICD-10-CM

## 2020-09-15 DIAGNOSIS — Z9189 Other specified personal risk factors, not elsewhere classified: Secondary | ICD-10-CM

## 2020-09-15 DIAGNOSIS — Z803 Family history of malignant neoplasm of breast: Secondary | ICD-10-CM

## 2020-09-15 MED ORDER — GADOBENATE DIMEGLUMINE 529 MG/ML IV SOLN
5.0000 mL | Freq: Once | INTRAVENOUS | Status: AC | PRN
  Administered 2020-09-15: 5 mL via INTRAVENOUS

## 2020-10-28 ENCOUNTER — Encounter: Payer: Self-pay | Admitting: Obstetrics & Gynecology

## 2020-10-28 ENCOUNTER — Other Ambulatory Visit (HOSPITAL_COMMUNITY)
Admission: RE | Admit: 2020-10-28 | Discharge: 2020-10-28 | Disposition: A | Discharge status: Home / Self Care | Payer: 59 | Source: Ambulatory Visit | Attending: Obstetrics & Gynecology | Admitting: Obstetrics & Gynecology

## 2020-10-28 ENCOUNTER — Ambulatory Visit (INDEPENDENT_AMBULATORY_CARE_PROVIDER_SITE_OTHER): Payer: 59 | Admitting: Obstetrics & Gynecology

## 2020-10-28 ENCOUNTER — Other Ambulatory Visit: Payer: Self-pay

## 2020-10-28 VITALS — BP 153/91 | HR 122 | Resp 16 | Ht 61.0 in | Wt 113.0 lb

## 2020-10-28 DIAGNOSIS — E2839 Other primary ovarian failure: Secondary | ICD-10-CM

## 2020-10-28 DIAGNOSIS — L900 Lichen sclerosus et atrophicus: Secondary | ICD-10-CM

## 2020-10-28 DIAGNOSIS — Z124 Encounter for screening for malignant neoplasm of cervix: Secondary | ICD-10-CM

## 2020-10-28 DIAGNOSIS — Z01419 Encounter for gynecological examination (general) (routine) without abnormal findings: Secondary | ICD-10-CM | POA: Diagnosis not present

## 2020-10-28 DIAGNOSIS — R000 Tachycardia, unspecified: Secondary | ICD-10-CM

## 2020-10-28 DIAGNOSIS — Z9189 Other specified personal risk factors, not elsewhere classified: Secondary | ICD-10-CM | POA: Diagnosis not present

## 2020-10-28 MED ORDER — METOPROLOL TARTRATE 25 MG PO TABS: 12.5000 mg | ORAL_TABLET | Freq: Two times a day (BID) | ORAL | 2 refills | Status: DC | PRN

## 2020-10-28 MED ORDER — TRIAMCINOLONE ACETONIDE 0.025 % EX OINT: 1.0000 "application " | TOPICAL_OINTMENT | Freq: Two times a day (BID) | CUTANEOUS | 1 refills | Status: DC

## 2020-10-28 MED ORDER — MOMETASONE FUROATE 0.1 % EX OINT: TOPICAL_OINTMENT | CUTANEOUS | 1 refills | Status: DC

## 2020-10-28 NOTE — Progress Notes (Signed)
60 y.o. G2P2 Married White or Caucasian female here for annual exam.  Doing well.  Denies vaginal bleeding.  Having Josph Macho touch treatment.  Thinks this has helped.  Thinks she has a hemorrhoid and would like me to check this.  Patient's last menstrual period was 10/16/2006.          Sexually active: Yes.    The current method of family planning is post menopausal status.    Exercising: Yes.    Smoker:  yes  Health Maintenance: Pap:  01/2017 neg with neg HR HPV History of abnormal Pap:  no MMG:  06/2020 Colonoscopy:  09/06/2012 BMD:  Will plan this fall TDaP:  2013 Pneumonia vaccine(s):  n/a Shingrix:   Not done Hep C testing: 11/16/2015 Screening Labs: 07/2019   reports that she has never smoked. She has never used smokeless tobacco. She reports that she does not drink alcohol and does not use drugs.  Past Medical History:  Diagnosis Date  . H/O pheochromocytoma   . H/O [facility] spotted fever   . MVP (mitral valve prolapse)   . PONV (postoperative nausea and vomiting)   . Rash    all over body in 03/13/? etiology  . TMJ (temporomandibular joint syndrome)     Past Surgical History:  Procedure Laterality Date  . BREAST LUMPECTOMY WITH RADIOACTIVE SEED LOCALIZATION Right 02/12/2020   Procedure: RIGHT BREAST LUMPECTOMY WITH RADIOACTIVE SEED LOCALIZATION;  Surgeon: Alphonsa Overall, MD;  Location: [place];  Service: General;  Laterality: Right;  . CHOLECYSTECTOMY     and exploratory surgery in pelvic area  . DILATION AND CURETTAGE OF UTERUS  6/04  . pheochromocytoma     removal in  1990  . WISDOM TOOTH EXTRACTION      Current Outpatient Medications  Medication Sig Dispense Refill  . Acetaminophen (TYLENOL 8 HOUR PO) Take 2 capsules by mouth as needed (AS NEEDED FOR HEADACHE).     Marland Kitchen metoprolol tartrate (LOPRESSOR) 25 MG tablet Take 0.5 tablets (12.5 mg total) by mouth 2 (two) times daily as needed. 30 tablet 2  . mometasone (ELOCON) 0.1 % ointment Apply  topically twice weekly to affected skin 45 g 1  . Multiple Vitamin (MULTIVITAMIN) tablet Take 1 tablet by mouth.     . triamcinolone (KENALOG) 0.025 % ointment Apply 1 application topically 2 (two) times daily.     No current facility-administered medications for this visit.    Family History  Problem Relation Age of Onset  . Breast cancer Mother 24       deceased age 38 from blood clot in lung secondary complications in breast cancer treatment  . Heart disease Paternal Grandfather   . Osteoporosis Maternal Grandmother   . Rheum arthritis Maternal Grandmother   . Bladder Cancer Maternal Grandfather     Review of Systems  All other systems reviewed and are negative.   Exam:   BP (!) 153/91   Pulse (!) 122   Resp 16   Ht 5\' 1"  (1.549 m)   Wt 113 lb (51.3 kg)   LMP 10/16/2006   BMI 21.35 kg/m   Height: 5\' 1"  (154.9 cm)  General appearance: alert, cooperative and appears stated age Head: Normocephalic, without obvious abnormality, atraumatic Neck: no adenopathy, supple, symmetrical, trachea midline and thyroid normal to inspection and palpation Lungs: clear to auscultation bilaterally Breasts: normal appearance, no masses or tenderness Heart: regular rate and rhythm Abdomen: soft, non-tender; bowel sounds normal; no masses,  no organomegaly Extremities:  extremities normal, atraumatic, no cyanosis or edema Skin: Skin color, texture, turgor normal. No rashes or lesions Lymph nodes: Cervical, supraclavicular, and axillary nodes normal. No abnormal inguinal nodes palpated Neurologic: Grossly normal   Pelvic: External genitalia:  no lesions              Urethra:  normal appearing urethra with no masses, tenderness or lesions              Bartholins and Skenes: normal                 Vagina: normal appearing vagina with normal color and discharge, no lesions              Cervix: no lesions              Pap taken: Yes.   Bimanual Exam:  Uterus:  normal size, contour, position,  consistency, mobility, non-tender              Adnexa: normal adnexa and no mass, fullness, tenderness               Rectovaginal: Confirms               Anus:  normal sphincter tone, no lesions  Chaperone, Sherryle Lis, Terence Lux, CMA, was present for exam.  Assessment/Plan: 1. Well woman exam with routine gynecological exam - pap with HR HPV obtained today - MMG 06/2020 - Colonoscopy done 09/06/2012 - Plan BMD in the fall - vaccines reviewed.  Aware to consider shingrix vaccination this year. - screening lab work done 07/2019    2. At high risk for breast cancer - will plan breast MRI after 09/15/2021.  May plan 6 months after last MMG.  3. Lichen sclerosus - currently having Josph Macho touch done with Dr. Helane Rima - triamcinolone (KENALOG) 0.025 % ointment; Apply 1 application topically 2 (two) times daily. Do not use for more than 5 days with symptoms.  Dispense: 30 g; Refill: 1 - mometasone (ELOCON) 0.1 % ointment; Apply topically twice weekly to affected skin  Dispense: 45 g; Refill: 1  4. Hypoestrogenism - DG DXA FRACTURE ASSESSMENT; Future  5. Sinus tachycardia - metoprolol tartrate (LOPRESSOR) 25 MG tablet; Take 0.5 tablets (12.5 mg total) by mouth 2 (two) times daily as needed.  Dispense: 30 tablet; Refill: 2

## 2020-10-29 LAB — CYTOLOGY - PAP
Comment: NEGATIVE
Diagnosis: NEGATIVE
High risk HPV: NEGATIVE

## 2020-11-11 ENCOUNTER — Ambulatory Visit: Payer: 59

## 2021-01-07 ENCOUNTER — Telehealth: Payer: Self-pay | Admitting: Cardiology

## 2021-01-07 NOTE — Telephone Encounter (Signed)
3.25.22 Spoke w/patient to schedule fu w/Dr Stanford Breed. She did not have her calendar with her and will call back to schedule. LP

## 2021-01-10 ENCOUNTER — Encounter: Payer: Self-pay | Admitting: *Deleted

## 2021-01-10 DIAGNOSIS — R918 Other nonspecific abnormal finding of lung field: Secondary | ICD-10-CM

## 2021-02-02 ENCOUNTER — Telehealth: Payer: Self-pay | Admitting: Cardiology

## 2021-02-02 NOTE — Telephone Encounter (Signed)
Spoke with patient regarding Tuesday 02/15/21 9:40 am CT chest no contrast appointment at [place], [address]---arrival time is 9:430 am for check in----liquids only 4 hour prior to study---Will mail information to patient and she voiced her understanding.

## 2021-02-15 ENCOUNTER — Ambulatory Visit
Admission: RE | Admit: 2021-02-15 | Discharge: 2021-02-15 | Disposition: A | Discharge status: Home / Self Care | Payer: 59 | Source: Ambulatory Visit | Attending: Cardiology | Admitting: Cardiology

## 2021-02-15 ENCOUNTER — Telehealth: Payer: Self-pay | Admitting: *Deleted

## 2021-02-15 DIAGNOSIS — R918 Other nonspecific abnormal finding of lung field: Secondary | ICD-10-CM

## 2021-02-15 MED ORDER — ROSUVASTATIN CALCIUM 10 MG PO TABS: 10.0000 mg | ORAL_TABLET | Freq: Every day | ORAL | 3 refills | Status: DC

## 2021-02-15 NOTE — Telephone Encounter (Signed)
New script sent to the pharmacy and Lab orders mailed to the pt

## 2021-02-15 NOTE — Telephone Encounter (Signed)
-----   Message from Lelon Perla, MD sent at 02/15/2021  4:48 PM EDT ----- Nodule unchanged; follow-up noncontrast chest CT 1 year; add Crestor 10 mg daily; lipids and liver in 12 weeks; patient notified of above. Kirk Ruths

## 2021-03-28 ENCOUNTER — Telehealth: Payer: Self-pay | Admitting: Cardiology

## 2021-03-28 NOTE — Telephone Encounter (Signed)
Spoke to patient she stated she started rosuvastatin on 5/14 she started having low back pain,dark yellow bubbly urine.Stated she stopped taking on 5/20 because she was going out of town.Stated she is not having back pain.Urine has cleared up except she continues to have bubbles in urine.She read bubbles in urine can come from too much protein.She wanted to know if she should restart rosuvastatin.Message sent to [facility] for advice.

## 2021-03-28 NOTE — Telephone Encounter (Signed)
Spoke with pt, Aware of dr crenshaw's recommendations.  °

## 2021-03-28 NOTE — Telephone Encounter (Signed)
Pt would like to speak with a nurse about rx that was prescribed to her (rosuvastatin) . Started taking it and had lower back pain as well as bubbles in her urine,

## 2021-04-12 ENCOUNTER — Encounter (HOSPITAL_COMMUNITY): Payer: Self-pay

## 2021-04-19 ENCOUNTER — Other Ambulatory Visit: Payer: Self-pay | Admitting: *Deleted

## 2021-04-19 DIAGNOSIS — E785 Hyperlipidemia, unspecified: Secondary | ICD-10-CM

## 2021-04-21 ENCOUNTER — Other Ambulatory Visit: Payer: Self-pay | Admitting: *Deleted

## 2021-04-21 DIAGNOSIS — E785 Hyperlipidemia, unspecified: Secondary | ICD-10-CM

## 2021-04-21 LAB — COMPREHENSIVE METABOLIC PANEL
ALT: 23 IU/L (ref 0–32)
AST: 30 IU/L (ref 0–40)
Albumin/Globulin Ratio: 1.8 (ref 1.2–2.2)
Albumin: 4.8 g/dL (ref 3.8–4.9)
Alkaline Phosphatase: 98 IU/L (ref 44–121)
BUN/Creatinine Ratio: 15 (ref 9–23)
BUN: 12 mg/dL (ref 6–24)
Bilirubin Total: 0.8 mg/dL (ref 0.0–1.2)
CO2: 25 mmol/L (ref 20–29)
Calcium: 9.9 mg/dL (ref 8.7–10.2)
Chloride: 99 mmol/L (ref 96–106)
Creatinine, Ser: 0.79 mg/dL (ref 0.57–1.00)
Globulin, Total: 2.6 g/dL (ref 1.5–4.5)
Glucose: 93 mg/dL (ref 65–99)
Potassium: 4.4 mmol/L (ref 3.5–5.2)
Sodium: 141 mmol/L (ref 134–144)
Total Protein: 7.4 g/dL (ref 6.0–8.5)
eGFR: 86 mL/min/{1.73_m2} (ref >59)

## 2021-04-21 LAB — LIPID PANEL
Chol/HDL Ratio: 2.2 ratio (ref 0.0–4.4)
Cholesterol, Total: 191 mg/dL (ref 100–199)
HDL: 86 mg/dL (ref >39)
LDL Chol Calc (NIH): 93 mg/dL (ref 0–99)
Triglycerides: 62 mg/dL (ref 0–149)
VLDL Cholesterol Cal: 12 mg/dL (ref 5–40)

## 2021-04-21 LAB — HEPATIC FUNCTION PANEL
ALT: 23 IU/L (ref 0–32)
AST: 27 IU/L (ref 0–40)
Albumin: 4.8 g/dL (ref 3.8–4.9)
Alkaline Phosphatase: 97 IU/L (ref 44–121)
Bilirubin Total: 0.7 mg/dL (ref 0.0–1.2)
Bilirubin, Direct: 0.19 mg/dL (ref 0.00–0.40)
Total Protein: 7.2 g/dL (ref 6.0–8.5)

## 2021-08-15 ENCOUNTER — Other Ambulatory Visit: Payer: Self-pay | Admitting: Obstetrics & Gynecology

## 2021-08-15 DIAGNOSIS — Z1231 Encounter for screening mammogram for malignant neoplasm of breast: Secondary | ICD-10-CM

## 2021-08-15 DIAGNOSIS — E2839 Other primary ovarian failure: Secondary | ICD-10-CM

## 2021-09-19 ENCOUNTER — Ambulatory Visit
Admission: RE | Admit: 2021-09-19 | Discharge: 2021-09-19 | Disposition: A | Discharge status: Home / Self Care | Payer: BC Managed Care – PPO | Source: Ambulatory Visit | Attending: Obstetrics & Gynecology | Admitting: Obstetrics & Gynecology

## 2021-09-19 DIAGNOSIS — Z1231 Encounter for screening mammogram for malignant neoplasm of breast: Secondary | ICD-10-CM

## 2021-10-26 ENCOUNTER — Encounter (HOSPITAL_BASED_OUTPATIENT_CLINIC_OR_DEPARTMENT_OTHER): Payer: Self-pay | Admitting: Obstetrics & Gynecology

## 2021-10-26 ENCOUNTER — Other Ambulatory Visit: Payer: Self-pay

## 2021-10-26 ENCOUNTER — Ambulatory Visit (INDEPENDENT_AMBULATORY_CARE_PROVIDER_SITE_OTHER): Payer: BC Managed Care – PPO | Admitting: Obstetrics & Gynecology

## 2021-10-26 VITALS — BP 164/83 | HR 118 | Ht 60.0 in | Wt 111.6 lb

## 2021-10-26 DIAGNOSIS — R030 Elevated blood-pressure reading, without diagnosis of hypertension: Secondary | ICD-10-CM | POA: Diagnosis not present

## 2021-10-26 DIAGNOSIS — R000 Tachycardia, unspecified: Secondary | ICD-10-CM | POA: Diagnosis not present

## 2021-10-26 DIAGNOSIS — Z01419 Encounter for gynecological examination (general) (routine) without abnormal findings: Secondary | ICD-10-CM

## 2021-10-26 DIAGNOSIS — Z9189 Other specified personal risk factors, not elsewhere classified: Secondary | ICD-10-CM | POA: Diagnosis not present

## 2021-10-26 DIAGNOSIS — E2839 Other primary ovarian failure: Secondary | ICD-10-CM | POA: Diagnosis not present

## 2021-10-26 DIAGNOSIS — R8290 Unspecified abnormal findings in urine: Secondary | ICD-10-CM | POA: Diagnosis not present

## 2021-10-26 DIAGNOSIS — L900 Lichen sclerosus et atrophicus: Secondary | ICD-10-CM | POA: Diagnosis not present

## 2021-10-26 LAB — POCT URINALYSIS DIPSTICK
Bilirubin, UA: NEGATIVE
Blood, UA: NEGATIVE
Glucose, UA: NEGATIVE
Ketones, UA: NEGATIVE
Leukocytes, UA: NEGATIVE
Nitrite, UA: NEGATIVE
Protein, UA: NEGATIVE
Spec Grav, UA: 1.01 (ref 1.010–1.025)
Urobilinogen, UA: 0.2 E.U./dL
pH, UA: 8 (ref 5.0–8.0)

## 2021-10-26 MED ORDER — TRIAMCINOLONE ACETONIDE 0.025 % EX OINT: 1.0000 "application " | TOPICAL_OINTMENT | Freq: Two times a day (BID) | CUTANEOUS | 1 refills | Status: DC

## 2021-10-26 MED ORDER — METOPROLOL TARTRATE 25 MG PO TABS: 12.5000 mg | ORAL_TABLET | Freq: Two times a day (BID) | ORAL | 2 refills | Status: DC | PRN

## 2021-10-26 NOTE — Progress Notes (Signed)
61 y.o. G2P2 Married White or Caucasian female here for annual exam.  Doing well.  H/o pulmonary nodules.  Had follow up scan this year and follow up 1 year recommended.  Coronary calcification was noted in the circumflex artery.  She discussed this with Dr. Stanford Breed.  She was placed on statin for a few weeks.  She noted some bubble when she voided and was concerned about having protein in her urine.  Reports she did have this tested and it was negative.  She would like it checked again.  She did stop the statin.   Did the [facility] touch recently.  Has done well with this.  Has done 4 treatments.    Patient's last menstrual period was 10/16/2006.          Sexually active: Yes.    The current method of family planning is post menopausal status.    Exercising: Yes.     Smoker:  no  Health Maintenance: Pap:  10/28/2020 Negative History of abnormal Pap:  no MMG:  09/19/2021 Negative Colonoscopy:  09/06/2012 BMD:   09/22/2010 Screening Labs: lab work done in July   reports that she has never smoked. She has never used smokeless tobacco. She reports that she does not drink alcohol and does not use drugs.  Past Medical History:  Diagnosis Date   H/O pheochromocytoma    H/O [facility] spotted fever    MVP (mitral valve prolapse)    PONV (postoperative nausea and vomiting)    Rash    all over body in 03/13/? etiology   TMJ (temporomandibular joint syndrome)     Past Surgical History:  Procedure Laterality Date   BREAST LUMPECTOMY WITH RADIOACTIVE SEED LOCALIZATION Right 02/12/2020   Procedure: RIGHT BREAST LUMPECTOMY WITH RADIOACTIVE SEED LOCALIZATION;  Surgeon: Alphonsa Overall, MD;  Location: [place];  Service: General;  Laterality: Right;   CHOLECYSTECTOMY     and exploratory surgery in pelvic area   DILATION AND CURETTAGE OF UTERUS  6/04   pheochromocytoma     removal in  [place]      Current Outpatient Medications  Medication Sig  Dispense Refill   Acetaminophen (TYLENOL 8 HOUR PO) Take 2 capsules by mouth as needed (AS NEEDED FOR HEADACHE).      mometasone (ELOCON) 0.1 % ointment Apply topically twice weekly to affected skin 45 g 1   metoprolol tartrate (LOPRESSOR) 25 MG tablet Take 0.5 tablets (12.5 mg total) by mouth 2 (two) times daily as needed. Use for palpitations. 30 tablet 2   Multiple Vitamin (MULTIVITAMIN) tablet Take 1 tablet by mouth.  (Patient not taking: Reported on 10/26/2021)     triamcinolone (KENALOG) 0.025 % ointment Apply 1 application topically 2 (two) times daily. Do not use for more than 5 days with symptoms. 30 g 1   No current facility-administered medications for this visit.    Family History  Problem Relation Age of Onset   Breast cancer Mother 43       deceased age 63 from blood clot in lung secondary complications in breast cancer treatment   Heart disease Paternal Grandfather    Osteoporosis Maternal Grandmother    Rheum arthritis Maternal Grandmother    Bladder Cancer Maternal Grandfather     Review of Systems  All other systems reviewed and are negative.  Exam:   BP (!) 164/83 (BP Location: Left Arm, Patient Position: Sitting, Cuff Size: Normal)    Pulse (!) 118  Ht 5' (1.524 m)    Wt 111 lb 9.6 oz (50.6 kg)    LMP 10/16/2006    BMI 21.80 kg/m   Height: 5' (152.4 cm)  General appearance: alert, cooperative and appears stated age Head: Normocephalic, without obvious abnormality, atraumatic Neck: no adenopathy, supple, symmetrical, trachea midline and thyroid normal to inspection and palpation Lungs: clear to auscultation bilaterally Breasts: normal appearance, no masses or tenderness Heart: regular rate and rhythm Abdomen: soft, non-tender; bowel sounds normal; no masses,  no organomegaly Extremities: extremities normal, atraumatic, no cyanosis or edema Skin: Skin color, texture, turgor normal. No rashes or lesions Lymph nodes: Cervical, supraclavicular, and axillary nodes  normal. No abnormal inguinal nodes palpated Neurologic: Grossly normal   Pelvic: External genitalia:  no lesions              Urethra:  normal appearing urethra with no masses, tenderness or lesions              Bartholins and Skenes: normal                 Vagina: normal appearing vagina with normal color and no discharge, no lesions              Cervix: no lesions              Pap taken: No. Bimanual Exam:  Uterus:  normal size, contour, position, consistency, mobility, non-tender              Adnexa: normal adnexa and no mass, fullness, tenderness               Rectovaginal: Confirms               Anus:  normal sphincter tone, no lesions  Chaperone, Octaviano Batty, CMA, was present for exam.  Assessment/Plan: 1. Well woman exam with routine gynecological exam - pap neg 2021 - MMG 09/2021 - colonoscopy 08/2012 - declines BMD - will have follow up labs  2. Lichen sclerosus - triamcinolone (KENALOG) 0.025 % ointment; Apply 1 application topically 2 (two) times daily. Do not use for more than 5 days with symptoms.  Dispense: 30 g; Refill: 1  3. Sinus tachycardia - metoprolol tartrate (LOPRESSOR) 25 MG tablet; Take 0.5 tablets (12.5 mg total) by mouth 2 (two) times daily as needed. Use for palpitations.  Dispense: 30 tablet; Refill: 2  4. White coat syndrome without diagnosis of hypertension  5. Hypoestrogenemia  6. At high risk for breast cancer - planning breast MRI

## 2022-01-05 ENCOUNTER — Other Ambulatory Visit (HOSPITAL_BASED_OUTPATIENT_CLINIC_OR_DEPARTMENT_OTHER): Payer: Self-pay | Admitting: Obstetrics & Gynecology

## 2022-01-05 DIAGNOSIS — Z9189 Other specified personal risk factors, not elsewhere classified: Secondary | ICD-10-CM

## 2022-01-10 ENCOUNTER — Other Ambulatory Visit: Payer: 59

## 2022-01-24 ENCOUNTER — Other Ambulatory Visit: Payer: Self-pay | Admitting: *Deleted

## 2022-01-24 DIAGNOSIS — R918 Other nonspecific abnormal finding of lung field: Secondary | ICD-10-CM

## 2022-04-04 ENCOUNTER — Telehealth (HOSPITAL_BASED_OUTPATIENT_CLINIC_OR_DEPARTMENT_OTHER): Payer: Self-pay | Admitting: *Deleted

## 2022-04-04 NOTE — Telephone Encounter (Signed)
-----   Message from Megan Salon, MD sent at 04/04/2022 12:00 AM EDT ----- Regarding: breast MRI Kim, Could you reach out to this pt?  She was going to have a breast MRI this year.  I ordered it in March.  It has not been done.  I can extend the order but am just wondering what she is thinking.

## 2022-04-04 NOTE — Telephone Encounter (Signed)
LMOVM for pt to call back regarding breast MRI order

## 2022-05-18 ENCOUNTER — Other Ambulatory Visit: Payer: Self-pay | Admitting: Cardiology

## 2022-05-18 DIAGNOSIS — R918 Other nonspecific abnormal finding of lung field: Secondary | ICD-10-CM

## 2022-08-30 ENCOUNTER — Other Ambulatory Visit: Payer: Self-pay | Admitting: Obstetrics & Gynecology

## 2022-08-30 ENCOUNTER — Telehealth (HOSPITAL_BASED_OUTPATIENT_CLINIC_OR_DEPARTMENT_OTHER): Payer: Self-pay | Admitting: *Deleted

## 2022-08-30 DIAGNOSIS — Z1231 Encounter for screening mammogram for malignant neoplasm of breast: Secondary | ICD-10-CM

## 2022-08-30 NOTE — Telephone Encounter (Signed)
Patient called and left a message that she is returning Kim's call.

## 2022-09-04 NOTE — Telephone Encounter (Signed)
Spoke with patient about MRI that had been ordered. Pt does wish to proceed with scheduling of MRI. Has had some changes in insurance. Screening mammogram is scheduled for January 2024 and she will schedule following that. Will ask provider to extend order for MRI.

## 2022-09-06 ENCOUNTER — Encounter: Payer: Self-pay | Admitting: Gastroenterology

## 2022-09-22 ENCOUNTER — Encounter: Payer: Self-pay | Admitting: Gastroenterology

## 2022-10-03 ENCOUNTER — Other Ambulatory Visit: Payer: Self-pay

## 2022-10-03 ENCOUNTER — Ambulatory Visit (AMBULATORY_SURGERY_CENTER): Payer: BC Managed Care – PPO | Admitting: *Deleted

## 2022-10-03 VITALS — Ht 61.0 in | Wt 110.0 lb

## 2022-10-03 DIAGNOSIS — Z1211 Encounter for screening for malignant neoplasm of colon: Secondary | ICD-10-CM

## 2022-10-03 MED ORDER — NA SULFATE-K SULFATE-MG SULF 17.5-3.13-1.6 GM/177ML PO SOLN: 1.0000 | Freq: Once | ORAL | 0 refills | Status: AC

## 2022-10-03 NOTE — Progress Notes (Signed)
Pre visit completed over telephone.  Instructions mailed and forwarded through [place].   No egg or soy allergy known to patient  No issues known to pt with past sedation with any surgeries or procedures Patient denies ever being told they had issues or difficulty with intubation  No FH of Malignant Hyperthermia Pt is not on diet pills Pt is not on  home 02  Pt is not on blood thinners  Pt denies issues with constipation  No A fib or A flutter  Pt instructed to use Single[employer] or GoodRx for a price reduction on prep

## 2022-10-17 ENCOUNTER — Telehealth: Payer: Self-pay | Admitting: Gastroenterology

## 2022-10-17 NOTE — Telephone Encounter (Signed)
Patient rescheduled for procedure due to being under the weather. Patient is also coughing up fleam as well.

## 2022-10-18 NOTE — Telephone Encounter (Signed)
Pt states she doesn't need new instructions sent to her

## 2022-10-19 ENCOUNTER — Encounter: Payer: BC Managed Care – PPO | Admitting: Gastroenterology

## 2022-10-31 ENCOUNTER — Ambulatory Visit
Admission: RE | Admit: 2022-10-31 | Discharge: 2022-10-31 | Disposition: A | Discharge status: Home / Self Care | Payer: BC Managed Care – PPO | Source: Ambulatory Visit | Attending: Obstetrics & Gynecology | Admitting: Obstetrics & Gynecology

## 2022-10-31 DIAGNOSIS — Z1231 Encounter for screening mammogram for malignant neoplasm of breast: Secondary | ICD-10-CM

## 2022-11-17 ENCOUNTER — Encounter: Payer: Self-pay | Admitting: Gastroenterology

## 2022-11-26 ENCOUNTER — Encounter: Payer: Self-pay | Admitting: Certified Registered Nurse Anesthetist

## 2022-11-28 ENCOUNTER — Ambulatory Visit: Payer: 59 | Admitting: Gastroenterology

## 2022-11-28 ENCOUNTER — Encounter: Payer: Self-pay | Admitting: Gastroenterology

## 2022-11-28 VITALS — BP 129/79 | HR 92 | Temp 96.8°F | Resp 16 | Ht 60.0 in | Wt 110.0 lb

## 2022-11-28 DIAGNOSIS — D128 Benign neoplasm of rectum: Secondary | ICD-10-CM

## 2022-11-28 DIAGNOSIS — K635 Polyp of colon: Secondary | ICD-10-CM

## 2022-11-28 DIAGNOSIS — D120 Benign neoplasm of cecum: Secondary | ICD-10-CM | POA: Diagnosis not present

## 2022-11-28 DIAGNOSIS — Z1211 Encounter for screening for malignant neoplasm of colon: Secondary | ICD-10-CM | POA: Diagnosis present

## 2022-11-28 MED ORDER — SODIUM CHLORIDE 0.9 % IV SOLN: 500.0000 mL | Freq: Once | INTRAVENOUS | Status: DC

## 2022-11-28 NOTE — Progress Notes (Signed)
[facility] History and Physical   Primary Care Physician:  Chesley Noon, MD   Reason for Procedure:  Colorectal cancer screening  Plan:    Screening colonoscopy with possible interventions as needed     HPI: Sheila Mcbride is a very pleasant 62 y.o. female here for screening colonoscopy. Denies any nausea, vomiting, abdominal pain, melena or bright red blood per rectum  The risks and benefits as well as alternatives of endoscopic procedure(s) have been discussed and reviewed. All questions answered. The patient agrees to proceed.    Past Medical History:  Diagnosis Date   H/O pheochromocytoma    23- operation since   H/O [place] spotted fever    MVP (mitral valve prolapse)    PONV (postoperative nausea and vomiting)    Rash    all over body in 03/13/? etiology   TMJ (temporomandibular joint syndrome)     Past Surgical History:  Procedure Laterality Date   BREAST LUMPECTOMY WITH RADIOACTIVE SEED LOCALIZATION Right 02/12/2020   Procedure: RIGHT BREAST LUMPECTOMY WITH RADIOACTIVE SEED LOCALIZATION;  Surgeon: Alphonsa Overall, MD;  Location: [facility];  Service: General;  Laterality: Right;   CHOLECYSTECTOMY     and exploratory surgery in pelvic area   COLONOSCOPY     DILATION AND CURETTAGE OF UTERUS  03/2003   pheochromocytoma     removal in  [place]      Prior to Admission medications   Medication Sig Start Date End Date Taking? Authorizing Provider  COLLAGEN PO Take by mouth.   Yes [provider]  metoprolol tartrate (LOPRESSOR) 25 MG tablet Take 0.5 tablets (12.5 mg total) by mouth 2 (two) times daily as needed. Use for palpitations. 10/26/21  Yes Megan Salon, MD  Acetaminophen (TYLENOL 8 HOUR PO) Take 2 capsules by mouth as needed (AS NEEDED FOR HEADACHE).     [provider]  mometasone (ELOCON) 0.1 % ointment Apply topically twice weekly to affected skin Patient not  taking: Reported on 10/03/2022 10/28/20   Megan Salon, MD  Multiple Vitamin (MULTIVITAMIN) tablet Take 1 tablet by mouth.  Patient not taking: Reported on 10/26/2021    [provider]  triamcinolone (KENALOG) 0.025 % ointment Apply 1 application topically 2 (two) times daily. Do not use for more than 5 days with symptoms. 10/26/21   Megan Salon, MD    Current Outpatient Medications  Medication Sig Dispense Refill   COLLAGEN PO Take by mouth.     metoprolol tartrate (LOPRESSOR) 25 MG tablet Take 0.5 tablets (12.5 mg total) by mouth 2 (two) times daily as needed. Use for palpitations. 30 tablet 2   Acetaminophen (TYLENOL 8 HOUR PO) Take 2 capsules by mouth as needed (AS NEEDED FOR HEADACHE).      mometasone (ELOCON) 0.1 % ointment Apply topically twice weekly to affected skin (Patient not taking: Reported on 10/03/2022) 45 g 1   Multiple Vitamin (MULTIVITAMIN) tablet Take 1 tablet by mouth.  (Patient not taking: Reported on 10/26/2021)     triamcinolone (KENALOG) 0.025 % ointment Apply 1 application topically 2 (two) times daily. Do not use for more than 5 days with symptoms. 30 g 1   Current Facility-Administered Medications  Medication Dose Route Frequency Provider Last Rate Last Admin   0.9 %  sodium chloride infusion  500 mL Intravenous Once Mauri Pole, MD        Allergies as of 11/28/2022 - Review Complete 11/28/2022  Allergen Reaction Noted   Sulfa antibiotics Hives 08/20/2012   Epinephrine Palpitations 12/16/2015    Family History  Problem Relation Age of Onset   Breast cancer Mother 55       deceased age 55 from blood clot in lung secondary complications in breast cancer treatment   Rectal cancer Brother 10   Osteoporosis Maternal Grandmother    Rheum arthritis Maternal Grandmother    Bladder Cancer Maternal Grandfather    Heart disease Paternal Grandfather    Stomach cancer Neg Hx    Colon cancer Neg Hx    Esophageal cancer Neg Hx     Social History    Socioeconomic History   Marital status: Married    Spouse name: Not on file   Number of children: 2   Years of education: Not on file   Highest education level: Not on file  Occupational History   Not on file  Tobacco Use   Smoking status: Never   Smokeless tobacco: Never  Vaping Use   Vaping Use: Never used  Substance and Sexual Activity   Alcohol use: No   Drug use: No   Sexual activity: Yes    Partners: Male    Birth control/protection: Other-see comments, Post-menopausal    Comment: vasectomy  Other Topics Concern   Not on file  Social History Narrative   Not on file   Social Determinants of Health   Financial Resource Strain: Not on file  Food Insecurity: Not on file  Transportation Needs: Not on file  Physical Activity: Not on file  Stress: Not on file  Social Connections: Not on file  Intimate Partner Violence: Not on file    Review of Systems:  All other review of systems negative except as mentioned in the HPI.  Physical Exam: Vital signs in last 24 hours: Blood Pressure (Abnormal) 153/82   Pulse (Abnormal) 115   Temperature (Abnormal) 96.8 F (36 C)   Respiration (Abnormal) 24   Height 5' (1.524 m)   Weight 110 lb (49.9 kg)   Last Menstrual Period 10/16/2006   Oxygen Saturation 100%   Body Mass Index 21.48 kg/m  General:   Alert, NAD Lungs:  Clear .   Heart:  Regular rate and rhythm Abdomen:  Soft, nontender and nondistended. Neuro/Psych:  Alert and cooperative. Normal mood and affect. A and O x 3  Reviewed labs, radiology imaging, old records and pertinent past GI work up  Patient is appropriate for planned procedure(s) and anesthesia in an ambulatory setting   K. Denzil Magnuson , MD [phone]

## 2022-11-28 NOTE — Progress Notes (Signed)
Report given to PACU, vss 

## 2022-11-28 NOTE — Progress Notes (Signed)
Called to room to assist during endoscopic procedure.  Patient ID and intended procedure confirmed with present staff. Received instructions for my participation in the procedure from the performing physician.  

## 2022-11-28 NOTE — Patient Instructions (Signed)
Please read handouts provided. Continue present medications. Await pathology results. Repeat colonoscopy in 3-5 years for screening based on pathology results. Return to GI clinic at next available appointment for hemorrhoidal band ligation.   YOU HAD AN ENDOSCOPIC PROCEDURE TODAY AT [facility]:   Refer to the procedure report that was given to you for any specific questions about what was found during the examination.  If the procedure report does not answer your questions, please call your gastroenterologist to clarify.  If you requested that your care partner not be given the details of your procedure findings, then the procedure report has been included in a sealed envelope for you to review at your convenience later.  YOU SHOULD EXPECT: Some feelings of bloating in the abdomen. Passage of more gas than usual.  Walking can help get rid of the air that was put into your GI tract during the procedure and reduce the bloating. If you had a lower endoscopy (such as a colonoscopy or flexible sigmoidoscopy) you may notice spotting of blood in your stool or on the toilet paper. If you underwent a bowel prep for your procedure, you may not have a normal bowel movement for a few days.  Please Note:  You might notice some irritation and congestion in your nose or some drainage.  This is from the oxygen used during your procedure.  There is no need for concern and it should clear up in a day or so.  SYMPTOMS TO REPORT IMMEDIATELY:  Following lower endoscopy (colonoscopy or flexible sigmoidoscopy):  Excessive amounts of blood in the stool  Significant tenderness or worsening of abdominal pains  Swelling of the abdomen that is new, acute  Fever of 100F or higher  For urgent or emergent issues, a gastroenterologist can be reached at any hour by calling [phone]. Do not use MyChart messaging for urgent concerns.    DIET:  We do recommend a small meal at first, but then you may  proceed to your regular diet.  Drink plenty of fluids but you should avoid alcoholic beverages for 24 hours.  ACTIVITY:  You should plan to take it easy for the rest of today and you should NOT DRIVE or use heavy machinery until tomorrow (because of the sedation medicines used during the test).    FOLLOW UP: Our staff will call the number listed on your records the next business day following your procedure.  We will call around 7:15- 8:00 am to check on you and address any questions or concerns that you may have regarding the information given to you following your procedure. If we do not reach you, we will leave a message.     If any biopsies were taken you will be contacted by phone or by letter within the next 1-3 weeks.  Please call us at [phone] if you have not heard about the biopsies in 3 weeks.    SIGNATURES/CONFIDENTIALITY: You and/or your care partner have signed paperwork which will be entered into your electronic medical record.  These signatures attest to the fact that that the information above on your After Visit Summary has been reviewed and is understood.  Full responsibility of the confidentiality of this discharge information lies with you and/or your care-partner.

## 2022-11-28 NOTE — Op Note (Addendum)
[facility] Patient Name: Sheila Mcbride Procedure Date: 11/28/2022 8:31 AM MRN: [id number] Endoscopist: Mauri Pole , MD, [id number] Age: 62 Referring MD:  Date of Birth: 1961/03/28 Gender: Female Account #: [id number] Procedure:                Colonoscopy Indications:              Screening for colorectal malignant neoplasm,                            Screening in patient at increased risk: Colorectal                            cancer in brother before age 27 Medicines:                Monitored Anesthesia Care Procedure:                Pre-Anesthesia Assessment:                           - Prior to the procedure, a History and Physical                            was performed, and patient medications and                            allergies were reviewed. The patient's tolerance of                            previous anesthesia was also reviewed. The risks                            and benefits of the procedure and the sedation                            options and risks were discussed with the patient.                            All questions were answered, and informed consent                            was obtained. Prior Anticoagulants: The patient has                            taken no anticoagulant or antiplatelet agents. ASA                            Grade Assessment: III - A patient with severe                            systemic disease. After reviewing the risks and                            benefits, the patient was deemed in satisfactory  condition to undergo the procedure.                           After obtaining informed consent, the colonoscope                            was passed under direct vision. Throughout the                            procedure, the patient's blood pressure, pulse, and                            oxygen saturations were monitored continuously. The                            [id number]-[id number]  Colonoscope [id number] was introduced                            through the anus and advanced to the the cecum,                            identified by its appearance. The colonoscopy was                            performed without difficulty. The patient tolerated                            the procedure well. The quality of the bowel                            preparation was good. The ileocecal valve,                            appendiceal orifice, and rectum were photographed. Scope In: 8:46:39 AM Scope Out: 9:05:25 AM Scope Withdrawal Time: 0 hours 11 minutes 13 seconds  Total Procedure Duration: 0 hours 18 minutes 46 seconds  Findings:                 The perianal and digital rectal examinations were                            normal.                           An 11 mm polyp was found in the cecum. The polyp                            was sessile. The polyp was removed with a cold                            snare. Resection and retrieval were complete.                           Scattered large-mouthed, medium-mouthed and  small-mouthed diverticula were found in the sigmoid                            colon, descending colon, transverse colon and                            ascending colon.                           Non-bleeding external and internal hemorrhoids were                            found during retroflexion. The hemorrhoids were                            medium-sized. Complications:            No immediate complications. Estimated Blood Loss:     Estimated blood loss was minimal. Impression:               - One 11 mm polyp in the cecum, removed with a cold                            snare. Resected and retrieved.                           - Diverticulosis in the sigmoid colon, in the                            descending colon, in the transverse colon and in                            the ascending colon.                           - Non-bleeding  external and internal hemorrhoids. Recommendation:           - Patient has a contact number available for                            emergencies. The signs and symptoms of potential                            delayed complications were discussed with the                            patient. Return to normal activities tomorrow.                            Written discharge instructions were provided to the                            patient.                           - Resume previous diet.                           -  Continue present medications.                           - Await pathology results.                           - Repeat colonoscopy in 3 - 5 years for                            surveillance based on pathology results.                           - Return to GI clinic at the next available                            appointment for hemorrhoidal band ligation. Mauri Pole, MD 11/28/2022 9:11:39 AM This report has been signed electronically.

## 2022-11-28 NOTE — Progress Notes (Signed)
Pt states she is nervous and "heart rate is normally high when I go to the doctor's office"  Pt's states no medical or surgical changes since previsit or office visit.

## 2022-11-29 ENCOUNTER — Telehealth: Payer: Self-pay

## 2022-11-29 NOTE — Telephone Encounter (Signed)
  Follow up Call-     11/28/2022    7:59 AM  Call back number  Post procedure Call Back phone  # [phone]  Permission to leave phone message Yes     Patient questions:  Do you have a fever, pain , or abdominal swelling? No. Pain Score  0 *  Have you tolerated food without any problems? Yes.    Have you been able to return to your normal activities? Yes.    Do you have any questions about your discharge instructions: Diet   No. Medications  No. Follow up visit  No.  Do you have questions or concerns about your Care? No.  Actions: * If pain score is 4 or above: No action needed, pain <4.

## 2022-12-05 ENCOUNTER — Encounter: Payer: Self-pay | Admitting: Gastroenterology

## 2022-12-14 ENCOUNTER — Encounter (HOSPITAL_BASED_OUTPATIENT_CLINIC_OR_DEPARTMENT_OTHER): Payer: Self-pay | Admitting: Obstetrics & Gynecology

## 2022-12-14 ENCOUNTER — Ambulatory Visit (HOSPITAL_BASED_OUTPATIENT_CLINIC_OR_DEPARTMENT_OTHER): Payer: 59 | Admitting: Obstetrics & Gynecology

## 2022-12-14 VITALS — BP 161/96 | HR 114 | Ht 60.5 in | Wt 109.4 lb

## 2022-12-14 DIAGNOSIS — R000 Tachycardia, unspecified: Secondary | ICD-10-CM

## 2022-12-14 DIAGNOSIS — R030 Elevated blood-pressure reading, without diagnosis of hypertension: Secondary | ICD-10-CM | POA: Diagnosis not present

## 2022-12-14 DIAGNOSIS — Z23 Encounter for immunization: Secondary | ICD-10-CM

## 2022-12-14 DIAGNOSIS — L900 Lichen sclerosus et atrophicus: Secondary | ICD-10-CM

## 2022-12-14 DIAGNOSIS — E7800 Pure hypercholesterolemia, unspecified: Secondary | ICD-10-CM | POA: Diagnosis not present

## 2022-12-14 DIAGNOSIS — Z13820 Encounter for screening for osteoporosis: Secondary | ICD-10-CM | POA: Diagnosis not present

## 2022-12-14 DIAGNOSIS — Z9189 Other specified personal risk factors, not elsewhere classified: Secondary | ICD-10-CM | POA: Diagnosis not present

## 2022-12-14 DIAGNOSIS — Z01419 Encounter for gynecological examination (general) (routine) without abnormal findings: Secondary | ICD-10-CM | POA: Diagnosis not present

## 2022-12-14 DIAGNOSIS — I7090 Unspecified atherosclerosis: Secondary | ICD-10-CM

## 2022-12-14 MED ORDER — METOPROLOL TARTRATE 25 MG PO TABS: 12.5000 mg | ORAL_TABLET | Freq: Two times a day (BID) | ORAL | 2 refills | Status: DC | PRN

## 2022-12-14 MED ORDER — MOMETASONE FUROATE 0.1 % EX OINT: TOPICAL_OINTMENT | CUTANEOUS | 2 refills | Status: DC

## 2022-12-14 NOTE — Progress Notes (Signed)
62 y.o. G2P2 Married White or Caucasian female here for annual exam.  Denies vaginal bleeding.  Did mona [place] touch and this helped some.  Left circumflex coronary artery disease noted on chest CT.   Has not had coronary CT.  She was placed on  statin.  Stopped it on her own.  Ready to proceed with coronary calcium score.  Also needs to follow up with Dr. Stanford Breed.  Has not seen PCP in a while.    Patient's last menstrual period was 10/16/2006.          Sexually active: Yes.    The current method of family planning is post menopausal status.    Smoker:  no  Health Maintenance: Pap:  10/28/2020 Negative History of abnormal Pap:  no MMG:  10/31/2022 Negative.  Would like to do MRI this year Colonoscopy:  11/28/2022, follow up 3 years BMD:   09/22/2010 Screening Labs: ordered   reports that she has never smoked. She has never used smokeless tobacco. She reports that she does not drink alcohol and does not use drugs.  Past Medical History:  Diagnosis Date   H/O pheochromocytoma    48- operation since   H/O [place] spotted fever    MVP (mitral valve prolapse)    PONV (postoperative nausea and vomiting)    Rash    all over body in 03/13/? etiology   TMJ (temporomandibular joint syndrome)     Past Surgical History:  Procedure Laterality Date   BREAST LUMPECTOMY WITH RADIOACTIVE SEED LOCALIZATION Right 02/12/2020   Procedure: RIGHT BREAST LUMPECTOMY WITH RADIOACTIVE SEED LOCALIZATION;  Surgeon: Alphonsa Overall, MD;  Location: [facility];  Service: General;  Laterality: Right;   CHOLECYSTECTOMY     and exploratory surgery in pelvic area   COLONOSCOPY     DILATION AND CURETTAGE OF UTERUS  03/2003   pheochromocytoma     removal in  [place] EXTRACTION      Current Outpatient Medications  Medication Sig Dispense Refill   Acetaminophen (TYLENOL 8 HOUR PO) Take 2 capsules by mouth as needed (AS NEEDED FOR HEADACHE).      COLLAGEN PO Take by mouth.      triamcinolone (KENALOG) 0.025 % ointment Apply 1 application topically 2 (two) times daily. Do not use for more than 5 days with symptoms. 30 g 1   metoprolol tartrate (LOPRESSOR) 25 MG tablet Take 0.5 tablets (12.5 mg total) by mouth 2 (two) times daily as needed. Use for palpitations. 30 tablet 2   mometasone (ELOCON) 0.1 % ointment Apply topically twice weekly to affected skin 45 g 2   No current facility-administered medications for this visit.    Family History  Problem Relation Age of Onset   Breast cancer Mother 57       deceased age 30 from blood clot in lung secondary complications in breast cancer treatment   Rectal cancer Brother 49   Osteoporosis Maternal Grandmother    Rheum arthritis Maternal Grandmother    Bladder Cancer Maternal Grandfather    Heart disease Paternal Grandfather    Stomach cancer Neg Hx    Colon cancer Neg Hx    Esophageal cancer Neg Hx     ROS: Constitutional: negative Genitourinary:negative  Exam:   BP (!) 161/96 (BP Location: Right Arm, Patient Position: Sitting, Cuff Size: Normal)   Pulse (!) 114   Ht 5' 0.5" (1.537 m)   Wt 109 lb 6.4 oz (49.6 kg)   LMP  10/16/2006   BMI 21.01 kg/m   Height: 5' 0.5" (153.7 cm)  General appearance: alert, cooperative and appears stated age Head: Normocephalic, without obvious abnormality, atraumatic Neck: no adenopathy, supple, symmetrical, trachea midline and thyroid normal to inspection and palpation Lungs: clear to auscultation bilaterally Breasts: normal appearance, no masses or tenderness Heart: regular rate and rhythm Abdomen: soft, non-tender; bowel sounds normal; no masses,  no organomegaly Extremities: extremities normal, atraumatic, no cyanosis or edema Skin: Skin color, texture, turgor normal. No rashes or lesions Lymph nodes: Cervical, supraclavicular, and axillary nodes normal. No abnormal inguinal nodes palpated Neurologic: Grossly normal   Pelvic: External genitalia:  no lesions               Urethra:  normal appearing urethra with no masses, tenderness or lesions              Bartholins and Skenes: normal                 Vagina: normal appearing vagina with normal color and no discharge, no lesions              Cervix: no lesions              Pap taken: No. Bimanual Exam:  Uterus:  normal size, contour, position, consistency, mobility, non-tender              Adnexa: normal adnexa and no mass, fullness, tenderness               Rectovaginal: Confirms               Anus:  normal sphincter tone, no lesions  Chaperone, Octaviano Batty, CMA, was present for exam.  Assessment/Plan: 1. Well woman exam with routine gynecological exam - Pap smear neg with neg HR HPV 10/28/2020 - Mammogram 09/19/2021 - Colonoscopy 09/06/2012 - Bone mineral density 09/22/2010 - lab work done ordered today - vaccines reviewed/updated   2. At high risk for breast cancer - pt did not have done last year but is ready to proceed this year - MR BREAST BILATERAL W WO CONTRAST INC CAD; Future  3. Osteoporosis screening - DG BONE DENSITY (DXA); Future  4. Elevated LDL cholesterol level - Comprehensive metabolic panel; Future - Hemoglobin A1c; Future - Lipid panel; Future - TSH; Future  5. Atherosclerosis - Comprehensive metabolic panel; Future - Hemoglobin A1c; Future - Lipid panel; Future - TSH; Future  6. Lichen sclerosus - skin looks normal today.  Continue topical steroid twice weekly - mometasone (ELOCON) 0.1 % ointment; Apply topically twice weekly to affected skin  Dispense: 45 g; Refill: 2  7. Sinus tachycardia - needs to follow up with cardiologist, Dr. Stanford Breed - metoprolol tartrate (LOPRESSOR) 25 MG tablet; Take 0.5 tablets (12.5 mg total) by mouth 2 (two) times daily as needed. Use for palpitations.  Dispense: 30 tablet; Refill: 2  8. Elevated blood pressure reading - pt will monitor.  H/o pheochromocytoma.  May need additional lab work as well.

## 2022-12-18 ENCOUNTER — Other Ambulatory Visit (HOSPITAL_BASED_OUTPATIENT_CLINIC_OR_DEPARTMENT_OTHER): Payer: 59

## 2022-12-18 DIAGNOSIS — I7090 Unspecified atherosclerosis: Secondary | ICD-10-CM

## 2022-12-18 DIAGNOSIS — E7800 Pure hypercholesterolemia, unspecified: Secondary | ICD-10-CM

## 2022-12-19 LAB — COMPREHENSIVE METABOLIC PANEL
ALT: 19 IU/L (ref 0–32)
AST: 28 IU/L (ref 0–40)
Albumin/Globulin Ratio: 2 (ref 1.2–2.2)
Albumin: 4.4 g/dL (ref 3.9–4.9)
Alkaline Phosphatase: 113 IU/L (ref 44–121)
BUN/Creatinine Ratio: 20 (ref 12–28)
BUN: 15 mg/dL (ref 8–27)
Bilirubin Total: 0.3 mg/dL (ref 0.0–1.2)
CO2: 23 mmol/L (ref 20–29)
Calcium: 9.3 mg/dL (ref 8.7–10.3)
Chloride: 100 mmol/L (ref 96–106)
Creatinine, Ser: 0.75 mg/dL (ref 0.57–1.00)
Globulin, Total: 2.2 g/dL (ref 1.5–4.5)
Glucose: 89 mg/dL (ref 70–99)
Potassium: 4.2 mmol/L (ref 3.5–5.2)
Sodium: 138 mmol/L (ref 134–144)
Total Protein: 6.6 g/dL (ref 6.0–8.5)
eGFR: 91 mL/min/{1.73_m2} (ref >59)

## 2022-12-19 LAB — TSH: TSH: 2.88 u[IU]/mL (ref 0.450–4.500)

## 2022-12-19 LAB — LIPID PANEL
Chol/HDL Ratio: 2.4 ratio (ref 0.0–4.4)
Cholesterol, Total: 168 mg/dL (ref 100–199)
HDL: 70 mg/dL (ref >39)
LDL Chol Calc (NIH): 86 mg/dL (ref 0–99)
Triglycerides: 61 mg/dL (ref 0–149)
VLDL Cholesterol Cal: 12 mg/dL (ref 5–40)

## 2022-12-19 LAB — HEMOGLOBIN A1C
Est. average glucose Bld gHb Est-mCnc: 105 mg/dL
Hgb A1c MFr Bld: 5.3 % (ref 4.8–5.6)

## 2023-01-01 ENCOUNTER — Ambulatory Visit
Admission: RE | Admit: 2023-01-01 | Discharge: 2023-01-01 | Disposition: A | Discharge status: Home / Self Care | Payer: 59 | Source: Ambulatory Visit | Attending: Obstetrics & Gynecology | Admitting: Obstetrics & Gynecology

## 2023-01-01 DIAGNOSIS — Z9189 Other specified personal risk factors, not elsewhere classified: Secondary | ICD-10-CM

## 2023-01-01 MED ORDER — GADOPICLENOL 0.5 MMOL/ML IV SOLN
5.0000 mL | Freq: Once | INTRAVENOUS | Status: AC | PRN
  Administered 2023-01-01: 5 mL via INTRAVENOUS

## 2023-10-23 ENCOUNTER — Other Ambulatory Visit: Payer: Self-pay | Admitting: Obstetrics & Gynecology

## 2023-10-23 DIAGNOSIS — Z1231 Encounter for screening mammogram for malignant neoplasm of breast: Secondary | ICD-10-CM

## 2023-11-07 ENCOUNTER — Ambulatory Visit
Admission: RE | Admit: 2023-11-07 | Discharge: 2023-11-07 | Disposition: A | Discharge status: Home / Self Care | Payer: 59 | Source: Ambulatory Visit | Attending: Obstetrics & Gynecology | Admitting: Obstetrics & Gynecology

## 2023-11-07 DIAGNOSIS — Z1231 Encounter for screening mammogram for malignant neoplasm of breast: Secondary | ICD-10-CM

## 2023-12-04 NOTE — Progress Notes (Signed)
 HPI: Follow-up palpitations.  Last seen January 15, 2020. 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  Urine for catecholamines and metanephrines March 2017 normal.  Echocardiogram March 2017 showed normal LV function, grade 1 diastolic dysfunction.  Nuclear study April 2017 showed ejection fraction 51%; inferior thinning consistent with soft tissue attenuation versus small scar but no ischemia. Abdominal CT November 2019 showed prior right adrenalectomy.  Left adrenal gland unremarkable.  Bilateral lower lobe pulmonary nodules measuring up to 6 mm in noncontrast chest CT recommended to follow-up.  Follow-up chest CT Feb 15, 2021 showed previously noted pulmonary nodules to be unchanged and follow-up recommended in 1 year.  Coronary calcification noted.  Since last seen she does not have dyspnea on exertion, orthopnea, PND, pedal edema or syncope.  She continues to have occasional palpitations described as a skip.  Not sustained.  Occasional chest tightness when she initiates exercise which then resolves.  She has had this for years.  Current Outpatient Medications  Medication Sig Dispense Refill   Acetaminophen (TYLENOL 8 HOUR PO) Take 2 capsules by mouth as needed (AS NEEDED FOR HEADACHE).      COLLAGEN PO Take by mouth.     metoprolol tartrate (LOPRESSOR) 25 MG tablet Take 0.5 tablets (12.5 mg total) by mouth 2 (two) times daily as needed. Use for palpitations. 30 tablet 2   mometasone (ELOCON) 0.1 % ointment Apply topically twice weekly to affected skin 45 g 2   triamcinolone (KENALOG) 0.025 % ointment Apply 1 application topically 2 (two) times daily. Do not use for more than 5 days with symptoms. 30 g 1   No current facility-administered medications for this visit.     Past Medical History:  Diagnosis Date   H/O pheochromocytoma    38- operation since   H/O [facility] spotted fever    MVP (mitral valve prolapse)    PONV  (postoperative nausea and vomiting)    Rash    all over body in 03/13/? etiology   TMJ (temporomandibular joint syndrome)     Past Surgical History:  Procedure Laterality Date   BREAST LUMPECTOMY WITH RADIOACTIVE SEED LOCALIZATION Right 02/12/2020   Procedure: RIGHT BREAST LUMPECTOMY WITH RADIOACTIVE SEED LOCALIZATION;  Surgeon: Ovidio Kin, MD;  Location: [facility];  Service: General;  Laterality: Right;   CHOLECYSTECTOMY     and exploratory surgery in pelvic area   COLONOSCOPY     DILATION AND CURETTAGE OF UTERUS  03/2003   pheochromocytoma     removal in  1990   WISDOM TOOTH EXTRACTION      Social History   Socioeconomic History   Marital status: Married    Spouse name: Not on file   Number of children: 2   Years of education: Not on file   Highest education level: Not on file  Occupational History   Not on file  Tobacco Use   Smoking status: Never   Smokeless tobacco: Never  Vaping Use   Vaping status: Never Used  Substance and Sexual Activity   Alcohol use: No   Drug use: No   Sexual activity: Yes    Partners: Male    Birth control/protection: Other-see comments, Post-menopausal    Comment: vasectomy  Other Topics Concern   Not on file  Social History Narrative   Not on file   Social Drivers of Health   Financial Resource Strain: Not on file  Food Insecurity: No Food Insecurity (  03/29/2021)   Received from [facility], [facility]   Hunger Vital Sign    Worried About Running Out of Food in the Last Year: Never true    Ran Out of Food in the Last Year: Never true  Transportation Needs: Not on file  Physical Activity: Not on file  Stress: Not on file  Social Connections: Unknown (02/26/2022)   Received from [facility], [facility]   Social Network    Social Network: Not on file  Intimate Partner Violence: Unknown (01/18/2022)   Received from [facility], [facility]   HITS    Physically Hurt: Not on file    Insult or Talk  Down To: Not on file    Threaten Physical Harm: Not on file    Scream or Curse: Not on file    Family History  Problem Relation Age of Onset   Breast cancer Mother 52       deceased age 39 from blood clot in lung secondary complications in breast cancer treatment   Rectal cancer Brother 92   Osteoporosis Maternal Grandmother    Rheum arthritis Maternal Grandmother    Bladder Cancer Maternal Grandfather    Heart disease Paternal Grandfather    Stomach cancer Neg Hx    Colon cancer Neg Hx    Esophageal cancer Neg Hx     ROS: no fevers or chills, productive cough, hemoptysis, dysphasia, odynophagia, melena, hematochezia, dysuria, hematuria, rash, seizure activity, orthopnea, PND, pedal edema, claudication. Remaining systems are negative.  Physical Exam: Well-developed well-nourished in no acute distress.  Skin is warm and dry.  HEENT is normal.  Neck is supple.  Chest is clear to auscultation with normal expansion.  Cardiovascular exam is regular rate and rhythm.  Abdominal exam nontender or distended. No masses palpated. Extremities show no edema. neuro grossly intact  EKG Interpretation Date/Time:  Monday December 17 2023 10:57:35 EST Ventricular Rate:  126 PR Interval:  152 QRS Duration:  70 QT Interval:  300 QTC Calculation: 434 R Axis:   71  Text Interpretation: Sinus tachycardia with occasional Premature ventricular complexes Marked ST abnormality, possible inferior subendocardial injury Confirmed by Olga Millers ([id number]) on 12/17/2023 11:08:05 AM  No change compared to January 15, 2020.  A/P  1 palpitations-patient was having palpitations today during the office visit and it was associated with PVCs.  Will repeat echocardiogram to reassess LV function.  I have given her prescription for Toprol 25 mg daily as needed.  2 pulmonary nodule-she will follow-up with primary care for this issue.  3 history of sinus tachycardia-note urine for metanephrines and catecholamines  were normal in 2017 and follow-up CT 2019 showed previous right adrenalectomy and normal left adrenal.  There is typically a 16% recurrence rate in patients with inherited disorders associated with pheochromocytoma.  However hers was spontaneous.  4 coronary calcification-will plan to proceed with calcium score for more full evaluation.  She did not tolerate Lipitor previously and is hesitant to take lipid medications.  If she has significantly elevated calcium score we will be more aggressive in medical therapy for hyperlipidemia.  Olga Millers, MD

## 2023-12-17 ENCOUNTER — Ambulatory Visit: Payer: 59 | Attending: Cardiology | Admitting: Cardiology

## 2023-12-17 ENCOUNTER — Encounter: Payer: Self-pay | Admitting: Cardiology

## 2023-12-17 VITALS — BP 158/96 | HR 126 | Ht 61.0 in | Wt 115.6 lb

## 2023-12-17 DIAGNOSIS — R918 Other nonspecific abnormal finding of lung field: Secondary | ICD-10-CM

## 2023-12-17 DIAGNOSIS — R002 Palpitations: Secondary | ICD-10-CM

## 2023-12-17 MED ORDER — METOPROLOL SUCCINATE ER 25 MG PO TB24
25.0000 mg | ORAL_TABLET | Freq: Every day | ORAL | 3 refills | Status: AC
Start: 2023-12-17 — End: ?

## 2023-12-17 NOTE — Patient Instructions (Signed)
 Medication Instructions:   STOP CURRENT METOPROLOL  START METOPROLOL 25 MG ONCE DAILY AT BEDTIME  *If you need a refill on your cardiac medications before your next appointment, please call your pharmacy*   Testing/Procedures:  Your physician has requested that you have an echocardiogram. Echocardiography is a painless test that uses sound waves to create images of your heart. It provides your doctor with information about the size and shape of your heart and how well your heart's chambers and valves are working. This procedure takes approximately one hour. There are no restrictions for this procedure. Please do NOT wear cologne, perfume, aftershave, or lotions (deodorant is allowed). Please arrive 15 minutes prior to your appointment time.  Please note: We ask at that you not bring children with you during ultrasound (echo/ vascular) testing. Due to room size and safety concerns, children are not allowed in the ultrasound rooms during exams. Our front office staff cannot provide observation of children in our lobby area while testing is being conducted. An adult accompanying a patient to their appointment will only be allowed in the ultrasound room at the discretion of the ultrasound technician under special circumstances. We apologize for any inconvenience. [facility]  CORONARY CALCIUM SCORING CT SCAN AT THE [facility]   Follow-Up: At [facility], you and your health needs are our priority.  As part of our continuing mission to provide you with exceptional heart care, we have created designated Provider Care Teams.  These Care Teams include your primary Cardiologist (physician) and Advanced Practice Providers (APPs -  Physician Assistants and Nurse Practitioners) who all work together to provide you with the care you need, when you need it.    Your next appointment:   12 month(s)  Provider:   Olga Millers MD

## 2023-12-24 ENCOUNTER — Other Ambulatory Visit (HOSPITAL_COMMUNITY)
Admission: RE | Admit: 2023-12-24 | Discharge: 2023-12-24 | Disposition: A | Discharge status: Home / Self Care | Source: Ambulatory Visit | Attending: Obstetrics & Gynecology | Admitting: Obstetrics & Gynecology

## 2023-12-24 ENCOUNTER — Encounter (HOSPITAL_BASED_OUTPATIENT_CLINIC_OR_DEPARTMENT_OTHER): Payer: Self-pay | Admitting: Obstetrics & Gynecology

## 2023-12-24 ENCOUNTER — Ambulatory Visit (INDEPENDENT_AMBULATORY_CARE_PROVIDER_SITE_OTHER): Payer: 59 | Admitting: Obstetrics & Gynecology

## 2023-12-24 VITALS — BP 168/78 | HR 95 | Ht 60.5 in | Wt 113.4 lb

## 2023-12-24 DIAGNOSIS — Z124 Encounter for screening for malignant neoplasm of cervix: Secondary | ICD-10-CM

## 2023-12-24 DIAGNOSIS — L900 Lichen sclerosus et atrophicus: Secondary | ICD-10-CM

## 2023-12-24 DIAGNOSIS — R030 Elevated blood-pressure reading, without diagnosis of hypertension: Secondary | ICD-10-CM

## 2023-12-24 DIAGNOSIS — Z9189 Other specified personal risk factors, not elsewhere classified: Secondary | ICD-10-CM | POA: Diagnosis not present

## 2023-12-24 DIAGNOSIS — Z01419 Encounter for gynecological examination (general) (routine) without abnormal findings: Secondary | ICD-10-CM

## 2023-12-24 DIAGNOSIS — Z86018 Personal history of other benign neoplasm: Secondary | ICD-10-CM

## 2023-12-24 MED ORDER — MOMETASONE FUROATE 0.1 % EX OINT: TOPICAL_OINTMENT | CUTANEOUS | 4 refills | Status: DC

## 2023-12-24 NOTE — Patient Instructions (Signed)
 Call [phone] to schedule a bone density test at [facility].

## 2023-12-24 NOTE — Progress Notes (Signed)
 ANNUAL EXAM Patient name: Sheila Mcbride MRN [id number]  Date of birth: 07-26-1961 Chief Complaint:   Annual Exam  History of Present Illness:   Sheila Mcbride is a 63 y.o. G2P2 Caucasian female being seen today for a routine annual exam.  Doing well.  Daughter is expecting her third child in mid July.  40th wedding anniversary is upcoming this year.  They are working on plans.  Denies vaginal bleeding.     Patient's last menstrual period was 10/16/2006.   Last pap 10/2020. Results were:  neg with neg HR HPV . H Last mammogram: 11/07/2023. Results were: normal. Family h/o breast cancer: yes mother .  Last MRI 12/2022.  Will plan again later this year.   Last colonoscopy: 11/2022. Results were: precancerous polyp.  Repeat 3 years. Family h/o colorectal cancer: yes half brother DEXA:  ordered     12/24/2023    9:21 AM 12/14/2022    2:42 PM 10/26/2021    3:36 PM  Depression screen PHQ 2/9  Decreased Interest 0 0 0  Down, Depressed, Hopeless 0 0 0  PHQ - 2 Score 0 0 0    Review of Systems:   Pertinent items are noted in HPI  Denies any headaches, blurred vision, fatigue, shortness of breath, chest pain, abdominal pain, abnormal vaginal discharge/itching/odor/irritation, problems with periods, bowel movements, urination, or intercourse unless otherwise stated above. Pertinent History Reviewed:  Reviewed past medical,surgical, social and family history.   Reviewed problem list, medications and allergies. Physical Assessment:   Vitals:   12/24/23 0917 12/24/23 0959  BP: (!) 171/90 (!) 168/78  Pulse: 95   Weight: 113 lb 6.4 oz (51.4 kg)   Height: 5' 0.5" (1.537 m)   Body mass index is 21.78 kg/m.        Physical Examination:   General appearance - well appearing, and in no distress  Mental status - alert, oriented to person, place, and time  Psych:  She has a normal mood and affect  Skin - warm and dry, normal color, no suspicious lesions noted  Chest -  effort normal, all lung fields clear to auscultation bilaterally  Heart - normal rate and regular rhythm  Neck:  midline trachea, no thyromegaly or nodules  Breasts - breasts appear normal, no suspicious masses, no skin or nipple changes or axillary nodes  Abdomen - soft, nontender, nondistended, no masses or organomegaly  Pelvic - VULVA: normal appearing vulva with no masses, tenderness or lesions   VAGINA: normal appearing vagina with normal color and discharge, no lesions   CERVIX: normal appearing cervix without discharge or lesions, no CMT  Thin prep pap is  HR HPV cotesting  UTERUS: uterus is felt to be normal size, shape, consistency and nontender   ADNEXA: No adnexal masses or tenderness noted.  Rectal - normal rectal, good sphincter tone, no masses felt.  Extremities:  No swelling or varicosities noted  Chaperone present for exam, Ina Homes, CMA.    Assessment & Plan:  1. Well woman exam with routine gynecological exam (Primary) - Pap smear with HR HPV obtained today - Mammogram 10/2022 - Colonoscopy 2024.  Follow up 3 years. - Bone mineral density ordered - lab work ordered for today - vaccines reviewed/updated  2. Cervical cancer screening - Cytology - PAP( )  3. At high risk for breast cancer - planning MRI later this year  4. History of pheochromocytoma  5. Lichen sclerosus - RF for mometasone that she is using  twice weekly.  6. Elevated blood pressure reading - she is following this at home    Meds:  Meds ordered this encounter  Medications   mometasone (ELOCON) 0.1 % ointment    Sig: Apply topically twice weekly to affected skin    Dispense:  45 g    Refill:  4    Follow-up: Return in about 1 year (around 12/23/2024).  Jerene Bears, MD 12/24/2023 9:59 AM

## 2023-12-24 NOTE — Progress Notes (Deleted)
   ANNUAL EXAM Patient name: Sheila Mcbride MRN [id number]  Date of birth: 01/07/1961 Chief Complaint:   Annual Exam  History of Present Illness:   Sheila Mcbride is a 63 y.o. G2P2 Caucasian female being seen today for a routine annual exam.  Current complaints: ***  Patient's last menstrual period was 10/16/2006.   The pregnancy intention screening data noted above was reviewed. Potential methods of contraception were discussed. The patient elected to proceed with No data recorded.   Last pap 10/28/2020. Results were: NILM w/ HRHPV negative. H/O abnormal pap: {yes/yes***/no:23866} Last mammogram: 11/07/2023. Results were: normal. Family h/o breast cancer: yes *** Last colonoscopy:  0 11/28/2022. Results were: {normal, abnormal, n/a:23837}. Family h/o colorectal cancer: {yes***/no:23838}     12/24/2023    9:21 AM 12/14/2022    2:42 PM 10/26/2021    3:36 PM  Depression screen PHQ 2/9  Decreased Interest 0 0 0  Down, Depressed, Hopeless 0 0 0  PHQ - 2 Score 0 0 0         No data to display           Review of Systems:   Pertinent items are noted in HPI Denies any headaches, blurred vision, fatigue, shortness of breath, chest pain, abdominal pain, abnormal vaginal discharge/itching/odor/irritation, problems with periods, bowel movements, urination, or intercourse unless otherwise stated above. Pertinent History Reviewed:  Reviewed past medical,surgical, social and family history.  Reviewed problem list, medications and allergies. Physical Assessment:   Vitals:   12/24/23 0917  BP: (!) 171/90  Pulse: 95  Weight: 113 lb 6.4 oz (51.4 kg)  Height: 5' 0.5" (1.537 m)  Body mass index is 21.78 kg/m.        Physical Examination:   General appearance - well appearing, and in no distress  Mental status - alert, oriented to person, place, and time  Psych:  She has a normal mood and affect  Skin - warm and dry, normal color, no suspicious lesions noted  Chest -  effort normal, all lung fields clear to auscultation bilaterally  Heart - normal rate and regular rhythm  Neck:  midline trachea, no thyromegaly or nodules  Breasts - breasts appear normal, no suspicious masses, no skin or nipple changes or  axillary nodes  Abdomen - soft, nontender, nondistended, no masses or organomegaly  Pelvic - VULVA: normal appearing vulva with no masses, tenderness or lesions  VAGINA: normal appearing vagina with normal color and discharge, no lesions  CERVIX: normal appearing cervix without discharge or lesions, no CMT  Thin prep pap is {Desc; done/not:10129} *** HR HPV cotesting  UTERUS: uterus is felt to be normal size, shape, consistency and nontender   ADNEXA: No adnexal masses or tenderness noted.  Rectal - normal rectal, good sphincter tone, no masses felt. Hemoccult: ***  Extremities:  No swelling or varicosities noted  Chaperone present for exam  No results found for this or any previous visit (from the past 24 hours).  Assessment & Plan:  1) Well-Woman Exam  2) ***  Labs/procedures today: ***  Mammogram: {Mammo f/u:25212::"@ 63yo"}, or sooner if problems Colonoscopy: {TCS f/u:25213::"@ 63yo"}, or sooner if problems  No orders of the defined types were placed in this encounter.   Meds: No orders of the defined types were placed in this encounter.   Follow-up: No follow-ups on file.  Sigmund Hazel, CMA 12/24/2023 9:21 AM

## 2023-12-25 LAB — COMPREHENSIVE METABOLIC PANEL
ALT: 22 IU/L (ref 0–32)
AST: 32 IU/L (ref 0–40)
Albumin: 4.4 g/dL (ref 3.9–4.9)
Alkaline Phosphatase: 100 IU/L (ref 44–121)
BUN/Creatinine Ratio: 15 (ref 12–28)
BUN: 11 mg/dL (ref 8–27)
Bilirubin Total: 0.5 mg/dL (ref 0.0–1.2)
CO2: 24 mmol/L (ref 20–29)
Calcium: 9.7 mg/dL (ref 8.7–10.3)
Chloride: 102 mmol/L (ref 96–106)
Creatinine, Ser: 0.71 mg/dL (ref 0.57–1.00)
Globulin, Total: 2.6 g/dL (ref 1.5–4.5)
Glucose: 91 mg/dL (ref 70–99)
Potassium: 4.3 mmol/L (ref 3.5–5.2)
Sodium: 141 mmol/L (ref 134–144)
Total Protein: 7 g/dL (ref 6.0–8.5)
eGFR: 96 mL/min/{1.73_m2} (ref >59)

## 2023-12-25 LAB — CBC
Hematocrit: 44.6 % (ref 34.0–46.6)
Hemoglobin: 14.6 g/dL (ref 11.1–15.9)
MCH: 30.3 pg (ref 26.6–33.0)
MCHC: 32.7 g/dL (ref 31.5–35.7)
MCV: 93 fL (ref 79–97)
Platelets: 333 10*3/uL (ref 150–450)
RBC: 4.82 x10E6/uL (ref 3.77–5.28)
RDW: 11.9 % (ref 11.7–15.4)
WBC: 6.5 10*3/uL (ref 3.4–10.8)

## 2023-12-25 LAB — LIPID PANEL
Chol/HDL Ratio: 2.2 ratio (ref 0.0–4.4)
Cholesterol, Total: 170 mg/dL (ref 100–199)
HDL: 77 mg/dL (ref >39)
LDL Chol Calc (NIH): 82 mg/dL (ref 0–99)
Triglycerides: 53 mg/dL (ref 0–149)
VLDL Cholesterol Cal: 11 mg/dL (ref 5–40)

## 2023-12-25 LAB — HEMOGLOBIN A1C
Est. average glucose Bld gHb Est-mCnc: 103 mg/dL
Hgb A1c MFr Bld: 5.2 % (ref 4.8–5.6)

## 2023-12-26 ENCOUNTER — Encounter (HOSPITAL_BASED_OUTPATIENT_CLINIC_OR_DEPARTMENT_OTHER): Payer: Self-pay | Admitting: Obstetrics & Gynecology

## 2023-12-31 LAB — CYTOLOGY - PAP
Comment: NEGATIVE
Diagnosis: NEGATIVE
Diagnosis: REACTIVE
High risk HPV: NEGATIVE

## 2024-01-07 ENCOUNTER — Ambulatory Visit (INDEPENDENT_AMBULATORY_CARE_PROVIDER_SITE_OTHER)

## 2024-01-07 DIAGNOSIS — R002 Palpitations: Secondary | ICD-10-CM

## 2024-01-07 LAB — ECHOCARDIOGRAM COMPLETE
Area-P 1/2: 3.74 cm2
MV M vel: 6.18 m/s
MV Peak grad: 152.9 mmHg
MV VTI: 0.3 cm2
Radius: 0.69 cm
S' Lateral: 2.78 cm

## 2024-01-08 ENCOUNTER — Other Ambulatory Visit: Payer: Self-pay | Admitting: *Deleted

## 2024-01-08 ENCOUNTER — Encounter: Payer: Self-pay | Admitting: *Deleted

## 2024-01-08 ENCOUNTER — Telehealth: Payer: Self-pay | Admitting: Cardiology

## 2024-01-08 DIAGNOSIS — R918 Other nonspecific abnormal finding of lung field: Secondary | ICD-10-CM

## 2024-01-08 DIAGNOSIS — I059 Rheumatic mitral valve disease, unspecified: Secondary | ICD-10-CM

## 2024-01-08 NOTE — Telephone Encounter (Signed)
 Patient identification verified by 2 forms. Marilynn Rail, RN    Called and spoke to patient  Patient states:   -received call regarding completing blood work prior to TEE   -recently completed labs with Dr. Hyacinth Meeker   -unsure if needs to repeat  Informed Patient:   -labs need to be completed 30 days prior to procedure   -labs completed by Dr. Hyacinth Meeker outside of 30days   -will need to complete orders, present to lab by 4/7  Patient verbalized understanding, no questions at this time

## 2024-01-08 NOTE — Telephone Encounter (Signed)
 Pt calling to make nurse aware that she just had requested labs done with PCP and would like the nurse to views those results. Please advise

## 2024-01-10 ENCOUNTER — Ambulatory Visit (HOSPITAL_BASED_OUTPATIENT_CLINIC_OR_DEPARTMENT_OTHER)
Admission: RE | Admit: 2024-01-10 | Discharge: 2024-01-10 | Disposition: A | Discharge status: Home / Self Care | Payer: Self-pay | Source: Ambulatory Visit | Attending: Cardiology | Admitting: Cardiology

## 2024-01-10 DIAGNOSIS — R002 Palpitations: Secondary | ICD-10-CM | POA: Insufficient documentation

## 2024-01-22 LAB — CBC

## 2024-01-23 LAB — CBC
Hematocrit: 42.3 % (ref 34.0–46.6)
Hemoglobin: 14 g/dL (ref 11.1–15.9)
MCH: 30 pg (ref 26.6–33.0)
MCHC: 33.1 g/dL (ref 31.5–35.7)
MCV: 91 fL (ref 79–97)
Platelets: 339 10*3/uL (ref 150–450)
RBC: 4.67 x10E6/uL (ref 3.77–5.28)
RDW: 12.2 % (ref 11.7–15.4)
WBC: 5.1 10*3/uL (ref 3.4–10.8)

## 2024-01-23 LAB — BASIC METABOLIC PANEL WITH GFR
BUN/Creatinine Ratio: 17 (ref 12–28)
BUN: 12 mg/dL (ref 8–27)
CO2: 25 mmol/L (ref 20–29)
Calcium: 9.6 mg/dL (ref 8.7–10.3)
Chloride: 100 mmol/L (ref 96–106)
Creatinine, Ser: 0.71 mg/dL (ref 0.57–1.00)
Potassium: 4.8 mmol/L (ref 3.5–5.2)
Potassium: 80 mmol/L (ref 70–99)
Sodium: 139 mmol/L (ref 134–144)
eGFR: 96 mL/min/{1.73_m2} (ref >59)

## 2024-01-24 ENCOUNTER — Encounter: Payer: Self-pay | Admitting: *Deleted

## 2024-01-24 NOTE — Progress Notes (Signed)
 Spoke to patient and instructed them to come at 0630  and to be NPO after 0000.  Medications reviewed.    Confirmed that patient will have a ride home and someone to stay with them for 24 hours after the procedure.

## 2024-01-24 NOTE — H&P (View-Only) (Signed)
 Spoke to patient and instructed them to come at 0630  and to be NPO after 0000.  Medications reviewed.    Confirmed that patient will have a ride home and someone to stay with them for 24 hours after the procedure.

## 2024-01-24 NOTE — Anesthesia Preprocedure Evaluation (Signed)
 Anesthesia Evaluation  Patient identified by MRN, date of birth, ID band Patient awake    Reviewed: Allergy & Precautions, NPO status , Patient's Chart, lab work & pertinent test results, reviewed documented beta blocker date and time   History of Anesthesia Complications (+) PONV and history of anesthetic complications  Airway Mallampati: III  TM Distance: >3 FB Neck ROM: Full    Dental  (+) Dental Advisory Given   Pulmonary neg shortness of breath, neg sleep apnea, neg COPD, neg recent URI Nodules    Pulmonary exam normal breath sounds clear to auscultation       Cardiovascular (-) hypertension(-) angina (-) Past MI, (-) Cardiac Stents and (-) CABG + dysrhythmias (PVCs) + Valvular Problems/Murmurs MVP and MR  Rhythm:Regular Rate:Tachycardia  TTE 01/07/2024: IMPRESSIONS    1. Left ventricular ejection fraction, by estimation, is 60 to 65%. Left  ventricular ejection fraction by 3D volume is 56 %. The left ventricle has  normal function. The left ventricle has no regional wall motion  abnormalities. Left ventricular diastolic   parameters were normal.   2. Right ventricular systolic function is normal. The right ventricular  size is normal. The estimated right ventricular systolic pressure is 31.7  mmHg.   3. There are discrepant date regarding quantification of mitral  insufficiency. MR vena contracta 4 mm. Effective regurgitant area is 0.15  cm sq by the PISA method, regurgitant volume 30 ml, regurgitant fraction  33%.. The mitral valve is myxomatous.  Moderate to severe mitral valve regurgitation. No evidence of mitral  stenosis. There is moderate holosystolic prolapse of both leaflets of the  mitral valve.   4. 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   5. The aortic valve is tricuspid. Aortic valve regurgitation is not  visualized. No aortic stenosis is present.   6. The  inferior vena cava is normal in size with greater than 50%  respiratory variability, suggesting right atrial pressure of 3 mmHg.     Neuro/Psych negative neurological ROS     GI/Hepatic negative GI ROS, Neg liver ROS,,,  Endo/Other  neg diabetes  H/o pheochromocytoma s/p surgery  Renal/GU negative Renal ROS     Musculoskeletal   Abdominal   Peds  Hematology Lab Results      Component                Value               Date                      WBC                      5.1                 01/22/2024                HGB                      14.0                01/22/2024                HCT                      42.3                01/22/2024  MCV                      91                  01/22/2024                PLT                      339                 01/22/2024              Anesthesia Other Findings   Reproductive/Obstetrics                             Anesthesia Physical Anesthesia Plan  ASA: 3  Anesthesia Plan: MAC   Post-op Pain Management:    Induction: Intravenous  PONV Risk Score and Plan: 3 and Propofol infusion, TIVA and Treatment may vary due to age or medical condition  Airway Management Planned: Natural Airway and Nasal Cannula  Additional Equipment:   Intra-op Plan:   Post-operative Plan:   Informed Consent: I have reviewed the patients History and Physical, chart, labs and discussed the procedure including the risks, benefits and alternatives for the proposed anesthesia with the patient or authorized representative who has indicated his/her understanding and acceptance.     Dental advisory given  Plan Discussed with: Anesthesiologist and CRNA  Anesthesia Plan Comments: (Discussed with patient risks of MAC including, but not limited to, minor pain or discomfort, hearing people in the room, and possible need for backup general anesthesia. Risks for general anesthesia also discussed including, but not  limited to, sore throat, hoarse voice, chipped/damaged teeth, injury to vocal cords, nausea and vomiting, allergic reactions, lung infection, heart attack, stroke, and death. All questions answered. )       Anesthesia Quick Evaluation

## 2024-01-25 ENCOUNTER — Ambulatory Visit (HOSPITAL_COMMUNITY)
Admission: RE | Admit: 2024-01-25 | Discharge: 2024-01-25 | Disposition: A | Discharge status: Home / Self Care | Source: Ambulatory Visit | Attending: Cardiovascular Disease | Admitting: Cardiovascular Disease

## 2024-01-25 ENCOUNTER — Ambulatory Visit (HOSPITAL_COMMUNITY)
Admission: RE | Admit: 2024-01-25 | Discharge: 2024-01-25 | Disposition: A | Discharge status: Home / Self Care | Attending: Cardiovascular Disease | Admitting: Cardiovascular Disease

## 2024-01-25 ENCOUNTER — Encounter (HOSPITAL_COMMUNITY): Payer: Self-pay | Admitting: Cardiovascular Disease

## 2024-01-25 ENCOUNTER — Other Ambulatory Visit: Payer: Self-pay

## 2024-01-25 ENCOUNTER — Ambulatory Visit (HOSPITAL_COMMUNITY): Admitting: Anesthesiology

## 2024-01-25 ENCOUNTER — Ambulatory Visit (HOSPITAL_BASED_OUTPATIENT_CLINIC_OR_DEPARTMENT_OTHER): Admitting: Anesthesiology

## 2024-01-25 ENCOUNTER — Encounter (HOSPITAL_COMMUNITY)
Admission: RE | Disposition: A | Discharge status: Home / Self Care | Payer: Self-pay | Source: Home / Self Care | Attending: Cardiovascular Disease

## 2024-01-25 DIAGNOSIS — I493 Ventricular premature depolarization: Secondary | ICD-10-CM | POA: Insufficient documentation

## 2024-01-25 DIAGNOSIS — I2510 Atherosclerotic heart disease of native coronary artery without angina pectoris: Secondary | ICD-10-CM | POA: Insufficient documentation

## 2024-01-25 DIAGNOSIS — I341 Nonrheumatic mitral (valve) prolapse: Secondary | ICD-10-CM | POA: Insufficient documentation

## 2024-01-25 DIAGNOSIS — R002 Palpitations: Secondary | ICD-10-CM | POA: Insufficient documentation

## 2024-01-25 DIAGNOSIS — Z79899 Other long term (current) drug therapy: Secondary | ICD-10-CM | POA: Insufficient documentation

## 2024-01-25 DIAGNOSIS — E785 Hyperlipidemia, unspecified: Secondary | ICD-10-CM | POA: Insufficient documentation

## 2024-01-25 DIAGNOSIS — R911 Solitary pulmonary nodule: Secondary | ICD-10-CM | POA: Insufficient documentation

## 2024-01-25 DIAGNOSIS — I340 Nonrheumatic mitral (valve) insufficiency: Secondary | ICD-10-CM | POA: Insufficient documentation

## 2024-01-25 DIAGNOSIS — I059 Rheumatic mitral valve disease, unspecified: Secondary | ICD-10-CM | POA: Diagnosis present

## 2024-01-25 DIAGNOSIS — I361 Nonrheumatic tricuspid (valve) insufficiency: Secondary | ICD-10-CM

## 2024-01-25 DIAGNOSIS — I499 Cardiac arrhythmia, unspecified: Secondary | ICD-10-CM | POA: Diagnosis not present

## 2024-01-25 HISTORY — PX: TRANSESOPHAGEAL ECHOCARDIOGRAM (CATH LAB): EP1270

## 2024-01-25 LAB — ECHO TEE
MV M vel: 5.29 m/s
MV Peak grad: 112 mmHg

## 2024-01-25 SURGERY — TRANSESOPHAGEAL ECHOCARDIOGRAM (TEE) (CATHLAB)
Anesthesia: Monitor Anesthesia Care

## 2024-01-25 MED ORDER — ONDANSETRON HCL 4 MG/2ML IJ SOLN
INTRAMUSCULAR | Status: DC | PRN
  Administered 2024-01-25: 4 mg via INTRAVENOUS

## 2024-01-25 MED ORDER — SODIUM CHLORIDE 0.9% FLUSH: 3.0000 mL | INTRAVENOUS | Status: DC | PRN

## 2024-01-25 MED ORDER — LIDOCAINE 2% (20 MG/ML) 5 ML SYRINGE
INTRAMUSCULAR | Status: DC | PRN
  Administered 2024-01-25: 40 mg via INTRAVENOUS

## 2024-01-25 MED ORDER — SODIUM CHLORIDE 0.9% FLUSH
3.0000 mL | Freq: Two times a day (BID) | INTRAVENOUS | Status: DC
Start: 2024-01-25 — End: 2024-01-25

## 2024-01-25 MED ORDER — PROPOFOL 500 MG/50ML IV EMUL
INTRAVENOUS | Status: DC | PRN
  Administered 2024-01-25: 75 ug/kg/min via INTRAVENOUS

## 2024-01-25 MED ORDER — PROPOFOL 10 MG/ML IV BOLUS
INTRAVENOUS | Status: DC | PRN
  Administered 2024-01-25: 60 mg via INTRAVENOUS

## 2024-01-25 MED ORDER — SODIUM CHLORIDE 0.9 % IV SOLN: INTRAVENOUS | Status: DC | PRN

## 2024-01-25 NOTE — Transfer of Care (Signed)
 Immediate Anesthesia Transfer of Care Note  Patient: Sheila Mcbride  Procedure(s) Performed: TRANSESOPHAGEAL ECHOCARDIOGRAM  Patient Location: PACU  Anesthesia Type:MAC  Level of Consciousness: sedated  Airway & Oxygen Therapy: Patient Spontanous Breathing  Post-op Assessment: Report given to RN  Post vital signs: Reviewed and stable  Last Vitals:  Vitals Value Taken Time  BP 110/65   Temp    Pulse 78   Resp 21   SpO2 98     Last Pain:  Vitals:   01/25/24 0650  TempSrc: Temporal  PainSc: 0-No pain         Complications: No notable events documented.

## 2024-01-25 NOTE — Op Note (Signed)
 INDICATIONS: Mitral insufficiency  PROCEDURE:   Informed consent was obtained prior to the procedure. The risks, benefits and alternatives for the procedure were discussed and the patient comprehended these risks.  Risks include, but are not limited to, cough, sore throat, vomiting, nausea, somnolence, esophageal and stomach trauma or perforation, bleeding, low blood pressure, aspiration, pneumonia, infection, trauma to the teeth and death.    After a procedural time-out, the oropharynx was anesthetized with 20% benzocaine spray.   During this procedure the patient was administered IV propofol by Dr. Freida Busman, Anesthesiology.  The transesophageal probe was inserted in the esophagus and stomach without difficulty and multiple views were obtained.  The patient was kept under observation until the patient left the procedure room.  The patient left the procedure room in stable condition.   Agitated microbubble saline contrast was not administered.  COMPLICATIONS:    There were no immediate complications.  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 Mitral insufficiency has two major jets, directed centrally on either side of the P2 middle scallop. Overall mitral insufficiency is at most moderate. There is systolic dominant flow in both left and right upper pulmonary veins. There is also tricuspid valve myxomatous change with mild TR. Otherwise normal echocardiogram.  RECOMMENDATIONS:     Monitor MR with transthoracic echo  Time Spent Directly with the Patient:  30 minutes   Talisa Petrak 01/25/2024, 8:09 AM

## 2024-01-25 NOTE — Anesthesia Postprocedure Evaluation (Signed)
 Anesthesia Post Note  Patient: Sheila Mcbride  Procedure(s) Performed: TRANSESOPHAGEAL ECHOCARDIOGRAM     Patient location during evaluation: PACU Anesthesia Type: MAC Level of consciousness: awake Pain management: pain level controlled Vital Signs Assessment: post-procedure vital signs reviewed and stable Respiratory status: spontaneous breathing, nonlabored ventilation and respiratory function stable Cardiovascular status: stable and blood pressure returned to baseline Postop Assessment: no apparent nausea or vomiting Anesthetic complications: no   No notable events documented.  Last Vitals:  Vitals:   01/25/24 0745 01/25/24 0815  BP: (!) 145/95 (!) 92/56  Pulse: (!) 106   Resp: 19 18  Temp:  (!) 36.4 C  SpO2: 100% 99%    Last Pain:  Vitals:   01/25/24 0815  TempSrc: Temporal  PainSc: Asleep                 Linton Rump

## 2024-01-25 NOTE — Interval H&P Note (Signed)
 History and Physical Interval Note:  01/25/2024 7:50 AM  Sheila Mcbride  has presented today for surgery, with the diagnosis of MITRAL VALVE DISORDER.  The various methods of treatment have been discussed with the patient and family. After consideration of risks, benefits and other options for treatment, the patient has consented to  Procedure(s): TRANSESOPHAGEAL ECHOCARDIOGRAM (N/A) as a surgical intervention.  The patient's history has been reviewed, patient examined, no change in status, stable for surgery.  I have reviewed the patient's chart and labs.  Questions were answered to the patient's satisfaction.     Grae Leathers

## 2024-02-11 NOTE — H&P (Addendum)
 HPI: Follow-up palpitations.  Last seen January 15, 2020. 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  Urine for catecholamines and metanephrines March 2017 normal.  Echocardiogram March 2017 showed normal LV function, grade 1 diastolic dysfunction.  Nuclear study April 2017 showed ejection fraction 51%; inferior thinning consistent with soft tissue attenuation versus small scar but no ischemia. Abdominal CT November 2019 showed prior right adrenalectomy.  Left adrenal gland unremarkable.  Bilateral lower lobe pulmonary nodules measuring up to 6 mm in noncontrast chest CT recommended to follow-up.  Follow-up chest CT Feb 15, 2021 showed previously noted pulmonary nodules to be unchanged and follow-up recommended in 1 year.  Coronary calcification noted.  Since last seen she does not have dyspnea on exertion, orthopnea, PND, pedal edema or syncope.  She continues to have occasional palpitations described as a skip.  Not sustained.  Occasional chest tightness when she initiates exercise which then resolves.  She has had this for years.         Current Outpatient Medications  Medication Sig Dispense Refill   Acetaminophen  (TYLENOL  8 HOUR PO) Take 2 capsules by mouth as needed (AS NEEDED FOR HEADACHE).        COLLAGEN PO Take by mouth.       metoprolol  tartrate (LOPRESSOR ) 25 MG tablet Take 0.5 tablets (12.5 mg total) by mouth 2 (two) times daily as needed. Use for palpitations. 30 tablet 2   mometasone  (ELOCON ) 0.1 % ointment Apply topically twice weekly to affected skin 45 g 2   triamcinolone  (KENALOG ) 0.025 % ointment Apply 1 application topically 2 (two) times daily. Do not use for more than 5 days with symptoms. 30 g 1      No current facility-administered medications for this visit.              Past Medical History:  Diagnosis Date   H/O pheochromocytoma      52- operation since   H/O [facility] spotted fever     MVP  (mitral valve prolapse)     PONV (postoperative nausea and vomiting)     Rash      all over body in 03/13/? etiology   TMJ (temporomandibular joint syndrome)                 Past Surgical History:  Procedure Laterality Date   BREAST LUMPECTOMY WITH RADIOACTIVE SEED LOCALIZATION Right 02/12/2020    Procedure: RIGHT BREAST LUMPECTOMY WITH RADIOACTIVE SEED LOCALIZATION;  Surgeon: Juanita Norlander, MD;  Location: [facility];  Service: General;  Laterality: Right;   CHOLECYSTECTOMY        and exploratory surgery in pelvic area   COLONOSCOPY       DILATION AND CURETTAGE OF UTERUS   03/2003   pheochromocytoma        removal in  1990   WISDOM TOOTH EXTRACTION              Social History         Socioeconomic History   Marital status: Married      Spouse name: Not on file   Number of children: 2   Years of education: Not on file   Highest education level: Not on file  Occupational History   Not on file  Tobacco Use   Smoking status: Never   Smokeless tobacco: Never  Vaping Use   Vaping status: Never Used  Substance and Sexual Activity  Alcohol use: No   Drug use: No   Sexual activity: Yes      Partners: Male      Birth control/protection: Other-see comments, Post-menopausal      Comment: vasectomy  Other Topics Concern   Not on file  Social History Narrative   Not on file    Social Drivers of Health        Financial Resource Strain: Not on file  Food Insecurity: No Food Insecurity (03/29/2021)    Received from [facility], [facility]    Hunger Vital Sign     Worried About Running Out of Food in the Last Year: Never true     Ran Out of Food in the Last Year: Never true  Transportation Needs: Not on file  Physical Activity: Not on file  Stress: Not on file  Social Connections: Unknown (02/26/2022)    Received from [facility], [facility]    Social Network     Social Network: Not on file  Intimate Partner Violence: Unknown (01/18/2022)     Received from [employer], [facility]    HITS     Physically Hurt: Not on file     Insult or Talk Down To: Not on file     Threaten Physical Harm: Not on file     Scream or Curse: Not on file           Family History  Problem Relation Age of Onset   Breast cancer Mother 77        deceased age 25 from blood clot in lung secondary complications in breast cancer treatment   Rectal cancer Brother 32   Osteoporosis Maternal Grandmother     Rheum arthritis Maternal Grandmother     Bladder Cancer Maternal Grandfather     Heart disease Paternal Grandfather     Stomach cancer Neg Hx     Colon cancer Neg Hx     Esophageal cancer Neg Hx            ROS: no fevers or chills, productive cough, hemoptysis, dysphasia, odynophagia, melena, hematochezia, dysuria, hematuria, rash, seizure activity, orthopnea, PND, pedal edema, claudication. Remaining systems are negative.   Physical Exam: Well-developed well-nourished in no acute distress.  Skin is warm and dry.  HEENT is normal.  Neck is supple.  Chest is clear to auscultation with normal expansion.  Cardiovascular exam is regular rate and rhythm.  Abdominal exam nontender or distended. No masses palpated. Extremities show no edema. neuro grossly intact   EKG Interpretation Date/Time:                  Monday December 17 2023 10:57:35 EST Ventricular Rate:         126 PR Interval:                 152 QRS Duration:             70 QT Interval:                 300 QTC Calculation:434 R Axis:                         71   Text Interpretation:Sinus tachycardia with occasional Premature ventricular complexes Marked ST abnormality, possible inferior subendocardial injury Confirmed by Alexandria Angel ([id number]) on 12/17/2023 11:08:05 AM  No change compared to January 15, 2020.   A/P   1 palpitations-patient was having palpitations today during the office  visit and it was associated with PVCs.  Will repeat echocardiogram to reassess LV function.  I  have given her prescription for Toprol  25 mg daily as needed.   2 pulmonary nodule-she will follow-up with primary care for this issue.   3 history of sinus tachycardia-note urine for metanephrines and catecholamines were normal in 2017 and follow-up CT 2019 showed previous right adrenalectomy and normal left adrenal.  There is typically a 16% recurrence rate in patients with inherited disorders associated with pheochromocytoma.  However hers was spontaneous.   4 coronary calcification-will plan to proceed with calcium  score for more full evaluation.  She did not tolerate Lipitor previously and is hesitant to take lipid medications.  If she has significantly elevated calcium  score we will be more aggressive in medical therapy for hyperlipidemia.   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  This patient was therefore scheduled for transesophageal echo.  01/25/2024 7:50 AM   Sheila Mcbride  has presented today for surgery, with the diagnosis of MITRAL VALVE DISORDER.  The various methods of treatment have been discussed with the patient and family. After consideration of risks, benefits and other options for treatment, the patient has consented to  Procedure(s): TRANSESOPHAGEAL ECHOCARDIOGRAM (N/A) as a surgical intervention.  The patient's history has been reviewed, patient examined, no change in status, stable for surgery.  I have reviewed the patient's chart and labs.  Questions were answered to the patient's satisfaction.       Wallis Vancott

## 2024-02-11 NOTE — H&P (Signed)
 HPI: Follow-up palpitations.  Last seen January 15, 2020. 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  Urine for catecholamines and metanephrines March 2017 normal.  Echocardiogram March 2017 showed normal LV function, grade 1 diastolic dysfunction.  Nuclear study April 2017 showed ejection fraction 51%; inferior thinning consistent with soft tissue attenuation versus small scar but no ischemia. Abdominal CT November 2019 showed prior right adrenalectomy.  Left adrenal gland unremarkable.  Bilateral lower lobe pulmonary nodules measuring up to 6 mm in noncontrast chest CT recommended to follow-up.  Follow-up chest CT Feb 15, 2021 showed previously noted pulmonary nodules to be unchanged and follow-up recommended in 1 year.  Coronary calcification noted.  Since last seen she does not have dyspnea on exertion, orthopnea, PND, pedal edema or syncope.  She continues to have occasional palpitations described as a skip.  Not sustained.  Occasional chest tightness when she initiates exercise which then resolves.  She has had this for years.         Current Outpatient Medications  Medication Sig Dispense Refill   Acetaminophen  (TYLENOL  8 HOUR PO) Take 2 capsules by mouth as needed (AS NEEDED FOR HEADACHE).        COLLAGEN PO Take by mouth.       metoprolol  tartrate (LOPRESSOR ) 25 MG tablet Take 0.5 tablets (12.5 mg total) by mouth 2 (two) times daily as needed. Use for palpitations. 30 tablet 2   mometasone  (ELOCON ) 0.1 % ointment Apply topically twice weekly to affected skin 45 g 2   triamcinolone  (KENALOG ) 0.025 % ointment Apply 1 application topically 2 (two) times daily. Do not use for more than 5 days with symptoms. 30 g 1      No current facility-administered medications for this visit.              Past Medical History:  Diagnosis Date   H/O pheochromocytoma      52- operation since   H/O [facility] spotted fever     MVP  (mitral valve prolapse)     PONV (postoperative nausea and vomiting)     Rash      all over body in 03/13/? etiology   TMJ (temporomandibular joint syndrome)                 Past Surgical History:  Procedure Laterality Date   BREAST LUMPECTOMY WITH RADIOACTIVE SEED LOCALIZATION Right 02/12/2020    Procedure: RIGHT BREAST LUMPECTOMY WITH RADIOACTIVE SEED LOCALIZATION;  Surgeon: Juanita Norlander, MD;  Location: [facility];  Service: General;  Laterality: Right;   CHOLECYSTECTOMY        and exploratory surgery in pelvic area   COLONOSCOPY       DILATION AND CURETTAGE OF UTERUS   03/2003   pheochromocytoma        removal in  1990   WISDOM TOOTH EXTRACTION              Social History         Socioeconomic History   Marital status: Married      Spouse name: Not on file   Number of children: 2   Years of education: Not on file   Highest education level: Not on file  Occupational History   Not on file  Tobacco Use   Smoking status: Never   Smokeless tobacco: Never  Vaping Use   Vaping status: Never Used  Substance and Sexual Activity  Alcohol use: No   Drug use: No   Sexual activity: Yes      Partners: Male      Birth control/protection: Other-see comments, Post-menopausal      Comment: vasectomy  Other Topics Concern   Not on file  Social History Narrative   Not on file    Social Drivers of Health        Financial Resource Strain: Not on file  Food Insecurity: No Food Insecurity (03/29/2021)    Received from [facility], [facility]    Hunger Vital Sign     Worried About Running Out of Food in the Last Year: Never true     Ran Out of Food in the Last Year: Never true  Transportation Needs: Not on file  Physical Activity: Not on file  Stress: Not on file  Social Connections: Unknown (02/26/2022)    Received from [facility], [facility]    Social Network     Social Network: Not on file  Intimate Partner Violence: Unknown (01/18/2022)     Received from [employer], [facility]    HITS     Physically Hurt: Not on file     Insult or Talk Down To: Not on file     Threaten Physical Harm: Not on file     Scream or Curse: Not on file           Family History  Problem Relation Age of Onset   Breast cancer Mother 77        deceased age 25 from blood clot in lung secondary complications in breast cancer treatment   Rectal cancer Brother 32   Osteoporosis Maternal Grandmother     Rheum arthritis Maternal Grandmother     Bladder Cancer Maternal Grandfather     Heart disease Paternal Grandfather     Stomach cancer Neg Hx     Colon cancer Neg Hx     Esophageal cancer Neg Hx            ROS: no fevers or chills, productive cough, hemoptysis, dysphasia, odynophagia, melena, hematochezia, dysuria, hematuria, rash, seizure activity, orthopnea, PND, pedal edema, claudication. Remaining systems are negative.   Physical Exam: Well-developed well-nourished in no acute distress.  Skin is warm and dry.  HEENT is normal.  Neck is supple.  Chest is clear to auscultation with normal expansion.  Cardiovascular exam is regular rate and rhythm.  Abdominal exam nontender or distended. No masses palpated. Extremities show no edema. neuro grossly intact   EKG Interpretation Date/Time:                  Monday December 17 2023 10:57:35 EST Ventricular Rate:         126 PR Interval:                 152 QRS Duration:             70 QT Interval:                 300 QTC Calculation:434 R Axis:                         71   Text Interpretation:Sinus tachycardia with occasional Premature ventricular complexes Marked ST abnormality, possible inferior subendocardial injury Confirmed by Alexandria Angel ([id number]) on 12/17/2023 11:08:05 AM  No change compared to January 15, 2020.   A/P   1 palpitations-patient was having palpitations today during the office  visit and it was associated with PVCs.  Will repeat echocardiogram to reassess LV function.  I  have given her prescription for Toprol  25 mg daily as needed.   2 pulmonary nodule-she will follow-up with primary care for this issue.   3 history of sinus tachycardia-note urine for metanephrines and catecholamines were normal in 2017 and follow-up CT 2019 showed previous right adrenalectomy and normal left adrenal.  There is typically a 16% recurrence rate in patients with inherited disorders associated with pheochromocytoma.  However hers was spontaneous.   4 coronary calcification-will plan to proceed with calcium  score for more full evaluation.  She did not tolerate Lipitor previously and is hesitant to take lipid medications.  If she has significantly elevated calcium  score we will be more aggressive in medical therapy for hyperlipidemia.   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  This patient was therefore scheduled for transesophageal echo.  01/25/2024 7:50 AM   Sheila Mcbride  has presented today for surgery, with the diagnosis of MITRAL VALVE DISORDER.  The various methods of treatment have been discussed with the patient and family. After consideration of risks, benefits and other options for treatment, the patient has consented to  Procedure(s): TRANSESOPHAGEAL ECHOCARDIOGRAM (N/A) as a surgical intervention.  The patient's history has been reviewed, patient examined, no change in status, stable for surgery.  I have reviewed the patient's chart and labs.  Questions were answered to the patient's satisfaction.       Wallis Vancott

## 2024-07-28 ENCOUNTER — Telehealth (HOSPITAL_BASED_OUTPATIENT_CLINIC_OR_DEPARTMENT_OTHER): Payer: Self-pay

## 2024-07-28 NOTE — Telephone Encounter (Signed)
 Pt called and stated that Dr. Cleotilde wanted her to get a MRI of the breast and no one has contacted her. Would like for someone to give her a call.

## 2024-07-29 ENCOUNTER — Other Ambulatory Visit (HOSPITAL_BASED_OUTPATIENT_CLINIC_OR_DEPARTMENT_OTHER): Payer: Self-pay

## 2024-07-29 DIAGNOSIS — Z9189 Other specified personal risk factors, not elsewhere classified: Secondary | ICD-10-CM

## 2024-07-29 NOTE — Telephone Encounter (Signed)
 Order for Breast MRI has been put into the system. Patient has been informed. We will be doing a precert. tbw

## 2024-08-12 ENCOUNTER — Other Ambulatory Visit

## 2024-08-26 ENCOUNTER — Ambulatory Visit
Admission: RE | Admit: 2024-08-26 | Discharge: 2024-08-26 | Disposition: A | Discharge status: Home / Self Care | Source: Ambulatory Visit | Attending: Obstetrics & Gynecology | Admitting: Obstetrics & Gynecology

## 2024-08-26 DIAGNOSIS — Z9189 Other specified personal risk factors, not elsewhere classified: Secondary | ICD-10-CM

## 2024-08-26 MED ORDER — GADOPICLENOL 0.5 MMOL/ML IV SOLN
5.0000 mL | Freq: Once | INTRAVENOUS | Status: AC | PRN
  Administered 2024-08-26: 5 mL via INTRAVENOUS

## 2024-08-27 ENCOUNTER — Ambulatory Visit (HOSPITAL_BASED_OUTPATIENT_CLINIC_OR_DEPARTMENT_OTHER): Payer: Self-pay | Admitting: Obstetrics & Gynecology

## 2024-09-10 ENCOUNTER — Other Ambulatory Visit
